# Patient Record
Sex: Male | Born: 1950 | Race: Black or African American | Hispanic: No | Marital: Single | Smoking: Never smoker
Health system: Southern US, Community
[De-identification: ages and names within clinical notes are randomized; demographics above are authoritative.]

## PROBLEM LIST (undated history)

## (undated) DIAGNOSIS — J45909 Unspecified asthma, uncomplicated: Secondary | ICD-10-CM

## (undated) DIAGNOSIS — I10 Essential (primary) hypertension: Secondary | ICD-10-CM

---

## 2008-07-25 ENCOUNTER — Ambulatory Visit: Payer: Self-pay | Admitting: Diagnostic Radiology

## 2008-07-25 ENCOUNTER — Emergency Department (HOSPITAL_BASED_OUTPATIENT_CLINIC_OR_DEPARTMENT_OTHER): Admission: EM | Admit: 2008-07-25 | Discharge: 2008-07-25 | Payer: Self-pay | Admitting: Emergency Medicine

## 2010-08-21 LAB — BASIC METABOLIC PANEL
BUN: 18 mg/dL (ref 6–23)
CO2: 28 mEq/L (ref 19–32)
Chloride: 105 mEq/L (ref 96–112)
Creatinine, Ser: 1.2 mg/dL (ref 0.4–1.5)
GFR calc Af Amer: >60 mL/min (ref >60)
Potassium: 3.5 mEq/L (ref 3.5–5.1)

## 2010-08-21 LAB — POCT I-STAT 3, VENOUS BLOOD GAS (G3P V)
Bicarbonate: 30.3 mEq/L — ABNORMAL HIGH (ref 20.0–24.0)
O2 Saturation: 67 %
TCO2: 32 mmol/L (ref 0–100)
pCO2, Ven: 52.7 mmHg — ABNORMAL HIGH (ref 45.0–50.0)
pH, Ven: 7.365 — ABNORMAL HIGH (ref 7.250–7.300)

## 2014-04-16 ENCOUNTER — Encounter (HOSPITAL_BASED_OUTPATIENT_CLINIC_OR_DEPARTMENT_OTHER): Payer: Self-pay | Admitting: Emergency Medicine

## 2014-04-16 ENCOUNTER — Emergency Department (HOSPITAL_BASED_OUTPATIENT_CLINIC_OR_DEPARTMENT_OTHER): Payer: 59

## 2014-04-16 DIAGNOSIS — S3992XA Unspecified injury of lower back, initial encounter: Secondary | ICD-10-CM | POA: Insufficient documentation

## 2014-04-16 DIAGNOSIS — Y998 Other external cause status: Secondary | ICD-10-CM | POA: Insufficient documentation

## 2014-04-16 DIAGNOSIS — I10 Essential (primary) hypertension: Secondary | ICD-10-CM | POA: Insufficient documentation

## 2014-04-16 DIAGNOSIS — Y9389 Activity, other specified: Secondary | ICD-10-CM | POA: Diagnosis not present

## 2014-04-16 DIAGNOSIS — J45909 Unspecified asthma, uncomplicated: Secondary | ICD-10-CM | POA: Diagnosis not present

## 2014-04-16 DIAGNOSIS — Y92410 Unspecified street and highway as the place of occurrence of the external cause: Secondary | ICD-10-CM | POA: Diagnosis not present

## 2014-04-16 DIAGNOSIS — Z79899 Other long term (current) drug therapy: Secondary | ICD-10-CM | POA: Diagnosis not present
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
## 2014-04-17 ENCOUNTER — Encounter (HOSPITAL_BASED_OUTPATIENT_CLINIC_OR_DEPARTMENT_OTHER): Payer: Self-pay | Admitting: Emergency Medicine

## 2014-04-17 ENCOUNTER — Emergency Department (HOSPITAL_BASED_OUTPATIENT_CLINIC_OR_DEPARTMENT_OTHER)
Admission: EM | Admit: 2014-04-17 | Discharge: 2014-04-17 | Disposition: A | Discharge status: Home / Self Care | Payer: 59 | Attending: Emergency Medicine | Admitting: Emergency Medicine

## 2014-04-17 DIAGNOSIS — M62830 Muscle spasm of back: Secondary | ICD-10-CM

## 2014-04-17 DIAGNOSIS — R52 Pain, unspecified: Secondary | ICD-10-CM

## 2014-04-17 DIAGNOSIS — S3992XA Unspecified injury of lower back, initial encounter: Secondary | ICD-10-CM | POA: Diagnosis not present

## 2014-04-17 HISTORY — DX: Unspecified asthma, uncomplicated: J45.909

## 2014-04-17 HISTORY — DX: Essential (primary) hypertension: I10

## 2014-04-17 MED ORDER — METHOCARBAMOL 500 MG PO TABS: 500.0000 mg | ORAL_TABLET | Freq: Two times a day (BID) | ORAL | Status: DC

## 2014-04-17 MED ORDER — NAPROXEN 375 MG PO TABS: 375.0000 mg | ORAL_TABLET | Freq: Two times a day (BID) | ORAL | Status: DC

## 2014-04-17 MED ORDER — NAPROXEN 250 MG PO TABS
500.0000 mg | ORAL_TABLET | Freq: Once | ORAL | Status: AC
  Administered 2014-04-17: 500 mg via ORAL
  Filled 2014-04-17: qty 2

## 2014-04-17 MED ORDER — METHOCARBAMOL 500 MG PO TABS
1000.0000 mg | ORAL_TABLET | Freq: Once | ORAL | Status: AC
  Administered 2014-04-17: 1000 mg via ORAL
  Filled 2014-04-17: qty 2
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
## 2014-06-13 ENCOUNTER — Emergency Department (HOSPITAL_BASED_OUTPATIENT_CLINIC_OR_DEPARTMENT_OTHER)
Admission: EM | Admit: 2014-06-13 | Discharge: 2014-06-13 | Disposition: A | Discharge status: Home / Self Care | Payer: Self-pay | Attending: Emergency Medicine | Admitting: Emergency Medicine

## 2014-06-13 ENCOUNTER — Encounter (HOSPITAL_BASED_OUTPATIENT_CLINIC_OR_DEPARTMENT_OTHER): Payer: Self-pay | Admitting: Emergency Medicine

## 2014-06-13 ENCOUNTER — Emergency Department (HOSPITAL_BASED_OUTPATIENT_CLINIC_OR_DEPARTMENT_OTHER): Payer: Self-pay

## 2014-06-13 DIAGNOSIS — Z760 Encounter for issue of repeat prescription: Secondary | ICD-10-CM | POA: Insufficient documentation

## 2014-06-13 DIAGNOSIS — Y998 Other external cause status: Secondary | ICD-10-CM | POA: Insufficient documentation

## 2014-06-13 DIAGNOSIS — S86912A Strain of unspecified muscle(s) and tendon(s) at lower leg level, left leg, initial encounter: Secondary | ICD-10-CM

## 2014-06-13 DIAGNOSIS — I10 Essential (primary) hypertension: Secondary | ICD-10-CM | POA: Insufficient documentation

## 2014-06-13 DIAGNOSIS — Y9289 Other specified places as the place of occurrence of the external cause: Secondary | ICD-10-CM | POA: Insufficient documentation

## 2014-06-13 DIAGNOSIS — S86812A Strain of other muscle(s) and tendon(s) at lower leg level, left leg, initial encounter: Secondary | ICD-10-CM | POA: Insufficient documentation

## 2014-06-13 DIAGNOSIS — J45909 Unspecified asthma, uncomplicated: Secondary | ICD-10-CM | POA: Insufficient documentation

## 2014-06-13 DIAGNOSIS — M25512 Pain in left shoulder: Secondary | ICD-10-CM | POA: Insufficient documentation

## 2014-06-13 DIAGNOSIS — R52 Pain, unspecified: Secondary | ICD-10-CM

## 2014-06-13 DIAGNOSIS — W000XXA Fall on same level due to ice and snow, initial encounter: Secondary | ICD-10-CM | POA: Insufficient documentation

## 2014-06-13 DIAGNOSIS — Z79899 Other long term (current) drug therapy: Secondary | ICD-10-CM | POA: Insufficient documentation

## 2014-06-13 DIAGNOSIS — Y9389 Activity, other specified: Secondary | ICD-10-CM | POA: Insufficient documentation

## 2014-06-13 MED ORDER — LISINOPRIL-HYDROCHLOROTHIAZIDE 10-12.5 MG PO TABS: 1.0000 | ORAL_TABLET | Freq: Every day | ORAL | Status: AC

## 2014-06-13 NOTE — Discharge Instructions (Signed)
Take Tylenol as directed for pain. Call Dr.Hudnall to schedule an office visit if you continue to have significant pain in your left knee or left shoulder within the next week.
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
## 2017-01-16 ENCOUNTER — Emergency Department (HOSPITAL_BASED_OUTPATIENT_CLINIC_OR_DEPARTMENT_OTHER): Payer: Medicare Other

## 2017-01-16 ENCOUNTER — Emergency Department (HOSPITAL_BASED_OUTPATIENT_CLINIC_OR_DEPARTMENT_OTHER)
Admission: EM | Admit: 2017-01-16 | Discharge: 2017-01-16 | Disposition: A | Discharge status: Home / Self Care | Payer: Medicare Other | Attending: Emergency Medicine | Admitting: Emergency Medicine

## 2017-01-16 ENCOUNTER — Encounter (HOSPITAL_BASED_OUTPATIENT_CLINIC_OR_DEPARTMENT_OTHER): Payer: Self-pay

## 2017-01-16 DIAGNOSIS — I10 Essential (primary) hypertension: Secondary | ICD-10-CM | POA: Insufficient documentation

## 2017-01-16 DIAGNOSIS — Y939 Activity, unspecified: Secondary | ICD-10-CM | POA: Insufficient documentation

## 2017-01-16 DIAGNOSIS — M7041 Prepatellar bursitis, right knee: Secondary | ICD-10-CM | POA: Insufficient documentation

## 2017-01-16 DIAGNOSIS — J45909 Unspecified asthma, uncomplicated: Secondary | ICD-10-CM | POA: Insufficient documentation

## 2017-01-16 DIAGNOSIS — M25561 Pain in right knee: Secondary | ICD-10-CM | POA: Diagnosis present

## 2017-01-16 DIAGNOSIS — Z79899 Other long term (current) drug therapy: Secondary | ICD-10-CM | POA: Insufficient documentation

## 2017-01-16 DIAGNOSIS — M7051 Other bursitis of knee, right knee: Secondary | ICD-10-CM

## 2017-01-16 MED ORDER — SULFAMETHOXAZOLE-TRIMETHOPRIM 800-160 MG PO TABS
1.0000 | ORAL_TABLET | Freq: Once | ORAL | Status: AC
  Administered 2017-01-16: 1 via ORAL
  Filled 2017-01-16: qty 1

## 2017-01-16 MED ORDER — NAPROXEN 375 MG PO TABS: 375.0000 mg | ORAL_TABLET | Freq: Two times a day (BID) | ORAL | 0 refills | Status: DC

## 2017-01-16 MED ORDER — SULFAMETHOXAZOLE-TRIMETHOPRIM 800-160 MG PO TABS: 1.0000 | ORAL_TABLET | Freq: Two times a day (BID) | ORAL | 0 refills | Status: AC

## 2017-01-16 MED ORDER — TRAMADOL HCL 50 MG PO TABS: 100.0000 mg | ORAL_TABLET | Freq: Four times a day (QID) | ORAL | 0 refills | Status: DC | PRN

## 2017-01-16 MED ORDER — NAPROXEN 250 MG PO TABS
500.0000 mg | ORAL_TABLET | Freq: Once | ORAL | Status: AC
  Administered 2017-01-16: 500 mg via ORAL
  Filled 2017-01-16: qty 2

## 2017-01-16 NOTE — ED Notes (Signed)
Ace wrap applied to right knee

## 2017-01-16 NOTE — ED Notes (Addendum)
Pt stated that he was kneeling while he was installing a water heater.  Pt stated that he did not feel the pain until midnight and noted that his right knee is red and swollen.  He took Tylenol 326 mg 2 tabs last time  was at 4 pm with minimal relief.  Patient took 8 tabs total within 24 hours.

## 2017-01-16 NOTE — ED Provider Notes (Signed)
[facility]   CSN: [id number] Arrival date & time: 01/16/17  1841     History   Chief Complaint Chief Complaint  Patient presents with  . Knee Pain    HPI Colin Lara is a 66 y.o. male.  HPI Patient reports that he was kneeling on a concrete surface yesterday to replace a water heater. He reports yesterday evening the knee started to get slightly sore. This morning he reports that he got more swelling and tenderness of the knee. He reports it is slightly red. He reports he can bend and straighten it but it is uncomfortable. No fevers, no chills, no general malaise. Patient reports he has something similar to this a couple of years ago on the opposite knee. He reports that he believes it was diagnosed as a bursitis. He reports that he took antibiotics at that time and had gotten a steroid injection. He reports it got better over time. He reports at the time he got evaluated though it was much more painful and swollen than this is today. Past Medical History:  Diagnosis Date  . Asthma   . Hypertension     There are no active problems to display for this patient.   History reviewed. No pertinent surgical history.     Home Medications    Prior to Admission medications   Medication Sig Start Date End Date Taking? Authorizing Provider  albuterol (PROVENTIL) (2.5 MG/3ML) 0.083% nebulizer solution Take 2.5 mg by nebulization every 6 (six) hours as needed for wheezing or shortness of breath.    [provider]  albuterol (PROVENTIL) (5 MG/ML) 0.5% nebulizer solution Take 2.5 mg by nebulization every 6 (six) hours as needed for wheezing or shortness of breath.    [provider]  lisinopril-hydrochlorothiazide (PRINZIDE) 10-12.5 MG per tablet Take 1 tablet by mouth daily. 06/13/14   Doug Sou, MD  lisinopril-hydrochlorothiazide (PRINZIDE,ZESTORETIC) 10-12.5 MG per tablet Take 1 tablet by mouth daily.    [provider]    naproxen (NAPROSYN) 375 MG tablet Take 1 tablet (375 mg total) by mouth 2 (two) times daily. 01/16/17   Arby Barrette, MD  sulfamethoxazole-trimethoprim (BACTRIM DS,SEPTRA DS) 800-160 MG tablet Take 1 tablet by mouth 2 (two) times daily. 01/16/17 01/23/17  Arby Barrette, MD  traMADol (ULTRAM) 50 MG tablet Take 2 tablets (100 mg total) by mouth every 6 (six) hours as needed. 1-2 tablets every 6 hours as needed for pain 01/16/17   Arby Barrette, MD    Family History No family history on file.  Social History Social History  Substance Use Topics  . Smoking status: Never Smoker  . Smokeless tobacco: Not on file  . Alcohol use Yes     Comment: twice a year     Allergies   Patient has no known allergies.   Review of Systems Review of Systems 10 Systems reviewed and are negative for acute change except as noted in the HPI.   Physical Exam Updated Vital Signs BP (!) 161/98 (BP Location: Right Arm)   Pulse 85   Temp 98.3 F (36.8 C) (Axillary)   Resp 16   SpO2 99%   Physical Exam  Constitutional: He is oriented to person, place, and time. He appears well-developed and well-nourished. No distress.  HENT:  Head: Normocephalic and atraumatic.  Eyes: EOM are normal.  Pulmonary/Chest: Effort normal.  Musculoskeletal:  Patient has moderate swelling of the prepatellar area on the right knee. Mild to moderate tenderness to movement of  the patella. Mild diffuse erythema and warmth. Patient does not have severe pain with range of motion of the joint. No edema of the lower leg or calf tenderness.  Neurological: He is alert and oriented to person, place, and time. No cranial nerve deficit. He exhibits normal muscle tone. Coordination normal.  Skin: Skin is warm and dry.  Psychiatric: He has a normal mood and affect.         ED Treatments / Results  Labs (all labs ordered are listed, but only abnormal results are displayed) Labs Reviewed - No data to display  EKG  EKG  Interpretation None       Radiology Dg Knee Complete 4 Views Right  Result Date: 01/16/2017 CLINICAL DATA:  Medial right knee pain EXAM: RIGHT KNEE - COMPLETE 4+ VIEW COMPARISON:  None. FINDINGS: Swelling anterior to the knee. No acute fracture or malalignment. No large joint effusion. Joint spaces are maintained IMPRESSION: Prepatellar soft tissue swelling.  No acute osseous abnormality Electronically Signed   By: Jasmine PangKim  Fujinaga M.D.   On: 01/16/2017 19:12    Procedures Procedures (including critical care time)  Medications Ordered in ED Medications  sulfamethoxazole-trimethoprim (BACTRIM DS,SEPTRA DS) 800-160 MG per tablet 1 tablet (1 tablet Oral Given 01/16/17 2149)  naproxen (NAPROSYN) tablet 500 mg (500 mg Oral Given 01/16/17 2149)     Initial Impression / Assessment and Plan / ED Course  I have reviewed the triage vital signs and the nursing notes.  Pertinent labs & imaging results that were available during my care of the patient were reviewed by me and considered in my medical decision making (see chart for details).     Final Clinical Impressions(s) / ED Diagnoses   Final diagnoses:  Patellar bursitis of right knee  Patient has prepatellar bursitis after kneeling on concrete yesterday. At this time, patient does have some erythema and warmth. Somewhat difficult to discern if this is inflammatory reaction or early cellulitis. Patient does not have pain within the joint to suggest septic joint. No palpable fluctuance although there is moderate swelling is visible in images. At this time, will empirically treat with Bactrim. Patient will take naproxen for pain control. Compression and elevation and icing reviewed. Patient will follow-up with orthopedics.  New Prescriptions New Prescriptions   NAPROXEN (NAPROSYN) 375 MG TABLET    Take 1 tablet (375 mg total) by mouth 2 (two) times daily.   SULFAMETHOXAZOLE-TRIMETHOPRIM (BACTRIM DS,SEPTRA DS) 800-160 MG TABLET    Take 1 tablet by  mouth 2 (two) times daily.   TRAMADOL (ULTRAM) 50 MG TABLET    Take 2 tablets (100 mg total) by mouth every 6 (six) hours as needed. 1-2 tablets every 6 hours as needed for pain     Arby BarrettePfeiffer, Aitana Burry, MD 01/16/17 2158

## 2017-01-16 NOTE — ED Triage Notes (Signed)
Pt c/o R knee pain that started last PM. Denies injury.

## 2018-01-18 ENCOUNTER — Other Ambulatory Visit: Payer: Self-pay

## 2018-01-18 ENCOUNTER — Emergency Department (HOSPITAL_BASED_OUTPATIENT_CLINIC_OR_DEPARTMENT_OTHER): Payer: Medicare Other

## 2018-01-18 ENCOUNTER — Encounter (HOSPITAL_BASED_OUTPATIENT_CLINIC_OR_DEPARTMENT_OTHER): Payer: Self-pay | Admitting: Emergency Medicine

## 2018-01-18 ENCOUNTER — Inpatient Hospital Stay (HOSPITAL_BASED_OUTPATIENT_CLINIC_OR_DEPARTMENT_OTHER)
Admission: EM | Admit: 2018-01-18 | Discharge: 2018-01-21 | DRG: 337 | Disposition: A | Discharge status: Home / Self Care | Payer: Medicare Other | Source: Other Acute Inpatient Hospital | Attending: Surgery | Admitting: Surgery

## 2018-01-18 DIAGNOSIS — Z888 Allergy status to other drugs, medicaments and biological substances status: Secondary | ICD-10-CM

## 2018-01-18 DIAGNOSIS — K5651 Intestinal adhesions [bands], with partial obstruction: Secondary | ICD-10-CM | POA: Diagnosis not present

## 2018-01-18 DIAGNOSIS — Z7951 Long term (current) use of inhaled steroids: Secondary | ICD-10-CM

## 2018-01-18 DIAGNOSIS — Z880 Allergy status to penicillin: Secondary | ICD-10-CM | POA: Diagnosis not present

## 2018-01-18 DIAGNOSIS — J45909 Unspecified asthma, uncomplicated: Secondary | ICD-10-CM | POA: Diagnosis not present

## 2018-01-18 DIAGNOSIS — Z91048 Other nonmedicinal substance allergy status: Secondary | ICD-10-CM | POA: Diagnosis not present

## 2018-01-18 DIAGNOSIS — I10 Essential (primary) hypertension: Secondary | ICD-10-CM | POA: Diagnosis not present

## 2018-01-18 DIAGNOSIS — Z79899 Other long term (current) drug therapy: Secondary | ICD-10-CM

## 2018-01-18 DIAGNOSIS — E7800 Pure hypercholesterolemia, unspecified: Secondary | ICD-10-CM | POA: Diagnosis not present

## 2018-01-18 DIAGNOSIS — K56609 Unspecified intestinal obstruction, unspecified as to partial versus complete obstruction: Secondary | ICD-10-CM | POA: Diagnosis not present

## 2018-01-18 DIAGNOSIS — Z0189 Encounter for other specified special examinations: Secondary | ICD-10-CM

## 2018-01-18 DIAGNOSIS — K439 Ventral hernia without obstruction or gangrene: Secondary | ICD-10-CM | POA: Diagnosis not present

## 2018-01-18 DIAGNOSIS — R52 Pain, unspecified: Secondary | ICD-10-CM

## 2018-01-18 LAB — CBC
HCT: 42 % (ref 39.0–52.0)
HEMATOCRIT: 42.8 % (ref 39.0–52.0)
HEMOGLOBIN: 14.5 g/dL (ref 13.0–17.0)
Hemoglobin: 13.8 g/dL (ref 13.0–17.0)
MCH: 29.4 pg (ref 26.0–34.0)
MCH: 30 pg (ref 26.0–34.0)
MCHC: 32.9 g/dL (ref 30.0–36.0)
MCHC: 33.9 g/dL (ref 30.0–36.0)
MCV: 89.4 fL (ref 78.0–100.0)
MCV: 88.6 fL (ref 78.0–100.0)
PLATELETS: 359 10*3/uL (ref 150–400)
Platelets: 331 10*3/uL (ref 150–400)
RBC: 4.7 MIL/uL (ref 4.22–5.81)
RBC: 4.83 MIL/uL (ref 4.22–5.81)
RDW: 13.9 % (ref 11.5–15.5)
RDW: 13.7 % (ref 11.5–15.5)
WBC: 11.7 10*3/uL — ABNORMAL HIGH (ref 4.0–10.5)
WBC: 10.6 10*3/uL — ABNORMAL HIGH (ref 4.0–10.5)

## 2018-01-18 LAB — COMPREHENSIVE METABOLIC PANEL
ALK PHOS: 69 U/L (ref 38–126)
ALT: 19 U/L (ref 0–44)
AST: 26 U/L (ref 15–41)
Albumin: 4.2 g/dL (ref 3.5–5.0)
Anion gap: 6 (ref 5–15)
BUN: 14 mg/dL (ref 8–23)
CALCIUM: 9.1 mg/dL (ref 8.9–10.3)
CHLORIDE: 102 mmol/L (ref 98–111)
CO2: 31 mmol/L (ref 22–32)
CREATININE: 0.96 mg/dL (ref 0.61–1.24)
GFR calc Af Amer: >60 mL/min (ref >60)
Glucose, Bld: 135 mg/dL — ABNORMAL HIGH (ref 70–99)
Potassium: 4.2 mmol/L (ref 3.5–5.1)
Sodium: 139 mmol/L (ref 135–145)
Total Bilirubin: 0.8 mg/dL (ref 0.3–1.2)
Total Protein: 7.8 g/dL (ref 6.5–8.1)

## 2018-01-18 LAB — URINALYSIS, MICROSCOPIC (REFLEX)

## 2018-01-18 LAB — URINALYSIS, ROUTINE W REFLEX MICROSCOPIC
BILIRUBIN URINE: NEGATIVE
Glucose, UA: NEGATIVE mg/dL
HGB URINE DIPSTICK: NEGATIVE
Ketones, ur: NEGATIVE mg/dL
Leukocytes, UA: NEGATIVE
Nitrite: NEGATIVE
PH: 7.5 (ref 5.0–8.0)
Protein, ur: 100 mg/dL — AB
SPECIFIC GRAVITY, URINE: 1.025 (ref 1.005–1.030)

## 2018-01-18 LAB — CREATININE, SERUM: Creatinine, Ser: 1 mg/dL (ref 0.61–1.24)

## 2018-01-18 LAB — LIPASE, BLOOD: LIPASE: 73 U/L — AB (ref 11–51)

## 2018-01-18 MED ORDER — MORPHINE SULFATE (PF) 2 MG/ML IV SOLN
1.0000 mg | INTRAVENOUS | Status: DC | PRN
  Administered 2018-01-18 – 2018-01-21 (×7): 2 mg via INTRAVENOUS
  Filled 2018-01-18 (×8): qty 1

## 2018-01-18 MED ORDER — HEPARIN SODIUM (PORCINE) 5000 UNIT/ML IJ SOLN
5000.0000 [IU] | Freq: Three times a day (TID) | INTRAMUSCULAR | Status: DC
  Administered 2018-01-18 – 2018-01-21 (×8): 5000 [IU] via SUBCUTANEOUS
  Filled 2018-01-18 (×8): qty 1

## 2018-01-18 MED ORDER — ONDANSETRON HCL 4 MG/2ML IJ SOLN
4.0000 mg | Freq: Four times a day (QID) | INTRAMUSCULAR | Status: DC | PRN
  Administered 2018-01-19: 4 mg via INTRAVENOUS
  Filled 2018-01-18: qty 2

## 2018-01-18 MED ORDER — ALBUTEROL SULFATE (2.5 MG/3ML) 0.083% IN NEBU: 2.5000 mg | INHALATION_SOLUTION | Freq: Four times a day (QID) | RESPIRATORY_TRACT | Status: DC | PRN

## 2018-01-18 MED ORDER — ONDANSETRON 4 MG PO TBDP: 4.0000 mg | ORAL_TABLET | Freq: Four times a day (QID) | ORAL | Status: DC | PRN

## 2018-01-18 MED ORDER — SODIUM CHLORIDE 0.9 % IV BOLUS
1000.0000 mL | Freq: Once | INTRAVENOUS | Status: AC
  Administered 2018-01-18: 1000 mL via INTRAVENOUS

## 2018-01-18 MED ORDER — IOPAMIDOL (ISOVUE-300) INJECTION 61%
100.0000 mL | Freq: Once | INTRAVENOUS | Status: AC | PRN
  Administered 2018-01-18: 100 mL via INTRAVENOUS

## 2018-01-18 MED ORDER — HYDRALAZINE HCL 20 MG/ML IJ SOLN: 10.0000 mg | INTRAMUSCULAR | Status: DC | PRN

## 2018-01-18 MED ORDER — KCL IN DEXTROSE-NACL 20-5-0.45 MEQ/L-%-% IV SOLN
INTRAVENOUS | Status: DC
  Administered 2018-01-18 – 2018-01-20 (×5): via INTRAVENOUS
  Filled 2018-01-18 (×4): qty 1000

## 2018-01-18 MED ORDER — ONDANSETRON HCL 4 MG/2ML IJ SOLN
4.0000 mg | Freq: Once | INTRAMUSCULAR | Status: AC
  Administered 2018-01-18: 4 mg via INTRAVENOUS
  Filled 2018-01-18: qty 2

## 2018-01-18 MED ORDER — MORPHINE SULFATE (PF) 4 MG/ML IV SOLN
4.0000 mg | Freq: Once | INTRAVENOUS | Status: AC
  Administered 2018-01-18: 4 mg via INTRAVENOUS
  Filled 2018-01-18: qty 1

## 2018-01-18 MED ORDER — FLUTICASONE FUROATE-VILANTEROL 200-25 MCG/INH IN AEPB
1.0000 | INHALATION_SPRAY | Freq: Every day | RESPIRATORY_TRACT | Status: DC
  Administered 2018-01-19 – 2018-01-21 (×3): 1 via RESPIRATORY_TRACT
  Filled 2018-01-18: qty 28

## 2018-01-18 MED ORDER — FAMOTIDINE IN NACL 20-0.9 MG/50ML-% IV SOLN
20.0000 mg | Freq: Two times a day (BID) | INTRAVENOUS | Status: DC
  Administered 2018-01-18 – 2018-01-21 (×6): 20 mg via INTRAVENOUS
  Filled 2018-01-18 (×6): qty 50

## 2018-01-18 MED ORDER — IOPAMIDOL (ISOVUE-300) INJECTION 61%
30.0000 mL | Freq: Once | INTRAVENOUS | Status: AC | PRN
  Administered 2018-01-18: 15 mL via ORAL

## 2018-01-18 NOTE — ED Notes (Signed)
Portable KUB done , Plunkett verified placement

## 2018-01-18 NOTE — H&P (Signed)
Chief Complaint: Abdominal pain nausea and vomiting  History of Present Illness:  Colin Lara is an 67 y.o. male presented to [facility] after drinking orange juice this morning and having acute onset of abdominal pain nausea and vomiting.  He had no prodrome or problems during the night.  He had a fairly bland dinner.  He is never had any prior surgery.  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  When he was seen in the [facility] a CT scan was obtained which showed dilated loops of small bowel in the left upper quadrant.  He also had stool in the  right side.  An NG tube was placed at that facility and since placement he has had output and marked relief of his discomfort.  He appears stable at the present time.  Past Medical History:  Diagnosis Date  . Asthma   . Hypertension     History reviewed. No pertinent surgical history.  No current facility-administered medications for this encounter.    Current Outpatient Medications  Medication Sig Dispense Refill  . albuterol (PROVENTIL) (2.5 MG/3ML) 0.083% nebulizer solution Take 2.5 mg by nebulization every 6 (six) hours as needed for wheezing or shortness of breath.    . ezetimibe (ZETIA) 10 MG tablet Take 10 mg by mouth daily.     . fluticasone furoate-vilanterol (BREO ELLIPTA) 200-25 MCG/INH AEPB Inhale 1 puff into the lungs daily.    Marland Kitchen lisinopril-hydrochlorothiazide (PRINZIDE) 10-12.5 MG per tablet Take 1 tablet by mouth daily. 30 tablet 0  . naproxen (NAPROSYN) 375 MG tablet Take 1 tablet (375 mg total) by mouth 2 (two) times daily. (Patient not taking: Reported on 01/18/2018) 20 tablet 0  . traMADol (ULTRAM) 50 MG tablet Take 2 tablets (100 mg total) by mouth every 6 (six) hours as needed. 1-2 tablets every 6 hours as needed for pain (Patient not taking: Reported on 01/18/2018) 20 tablet 0   Penicillins; Pollen extract; and Statins No  family history on file. Social History:   reports that he has never smoked. He has never used smokeless tobacco. He reports that he drinks alcohol. He reports that he does not use drugs.   REVIEW OF SYSTEMS : Negative except for unremarkable  Physical Exam:   Blood pressure (!) 153/98, pulse 81, temperature 97.8 F (36.6 C), temperature source Oral, resp. rate 18, height 5' 10.5" (1.791 m), weight 78 kg, SpO2 96 %. Body mass index is 24.33 kg/m.  Gen:  WDWN African-American male NAD  Neurological: Alert and oriented to person, place, and time. Motor and sensory function is grossly intact  Head: Normocephalic and atraumatic.  Eyes: Conjunctivae are normal. Pupils are equal, round, and reactive to light. No scleral icterus.  Neck: Normal range of motion. Neck supple. No tracheal deviation or thyromegaly present.  Cardiovascular:  SR without murmurs or gallops.  No carotid bruits Breast: Not examined Respiratory: Effort normal.  No respiratory distress. No chest wall tenderness. Breath sounds normal.  No wheezes, rales or rhonchi.  Abdomen: Mildly distended, no rebound or guarding.  I can palpate the epigastric hernia in the midline but there is nothing incarcerated in it and it is a vague defect. GU: Unremarkable Musculoskeletal: Normal range of motion. Extremities are nontender. No cyanosis, edema or clubbing noted Lymphadenopathy: No cervical, preauricular, postauricular or axillary adenopathy is present Skin: Skin is warm and dry. No rash noted. No diaphoresis. No  erythema. No pallor. Pscyh: Normal mood and affect. Behavior is normal. Judgment and thought content normal.   LABORATORY RESULTS: Results for orders placed or performed during the hospital encounter of 01/18/18 (from the past 48 hour(s))  Urinalysis, Routine w reflex microscopic     Status: Abnormal   Collection Time: 01/18/18  2:11 PM  Result Value Ref Range   Color, Urine YELLOW YELLOW   APPearance CLEAR CLEAR    Specific Gravity, Urine 1.025 1.005 - 1.030   pH 7.5 5.0 - 8.0   Glucose, UA NEGATIVE NEGATIVE mg/dL   Hgb urine dipstick NEGATIVE NEGATIVE   Bilirubin Urine NEGATIVE NEGATIVE   Ketones, ur NEGATIVE NEGATIVE mg/dL   Protein, ur 100 (A) NEGATIVE mg/dL   Nitrite NEGATIVE NEGATIVE   Leukocytes, UA NEGATIVE NEGATIVE    Comment: Performed at [facility], [address] [place], [place] [address]  Urinalysis, Microscopic (reflex)     Status: Abnormal   Collection Time: 01/18/18  2:11 PM  Result Value Ref Range   RBC / HPF 0-5 0 - 5 RBC/hpf   WBC, UA 0-5 0 - 5 WBC/hpf   Bacteria, UA RARE (A) NONE SEEN   Squamous Epithelial / LPF 0-5 0 - 5   Mucus PRESENT    Hyaline Casts, UA PRESENT     Comment: Performed at [facility], [address] [place], [place] [address]  Lipase, blood     Status: Abnormal   Collection Time: 01/18/18  2:38 PM  Result Value Ref Range   Lipase 73 (H) 11 - 51 U/L    Comment: Performed at [facility], [address] [place], [place] [address]  Comprehensive metabolic panel     Status: Abnormal   Collection Time: 01/18/18  2:38 PM  Result Value Ref Range   Sodium 139 135 - 145 mmol/L   Potassium 4.2 3.5 - 5.1 mmol/L   Chloride 102 98 - 111 mmol/L   CO2 31 22 - 32 mmol/L   Glucose, Bld 135 (H) 70 - 99 mg/dL   BUN 14 8 - 23 mg/dL   Creatinine, Ser 0.96 0.61 - 1.24 mg/dL   Calcium 9.1 8.9 - 10.3 mg/dL   Total Protein 7.8 6.5 - 8.1 g/dL   Albumin 4.2 3.5 - 5.0 g/dL   AST 26 15 - 41 U/L   ALT 19 0 - 44 U/L   Alkaline Phosphatase 69 38 - 126 U/L   Total Bilirubin 0.8 0.3 - 1.2 mg/dL   GFR calc non Af Amer >60 >60 mL/min   GFR calc Af Amer >60 >60 mL/min    Comment: (NOTE) The eGFR has been calculated using the CKD EPI equation. This calculation has not been validated in all clinical situations. eGFR's persistently <60 mL/min signify possible Chronic Kidney Disease.    Anion gap 6 5 - 15    Comment: Performed at [facility], [address] [place], [place] [address]  CBC     Status: Abnormal   Collection Time: 01/18/18  2:38 PM  Result Value Ref Range   WBC 10.6 (H) 4.0 - 10.5 K/uL   RBC 4.83 4.22 - 5.81 MIL/uL   Hemoglobin 14.5 13.0 - 17.0 g/dL   HCT 42.8 39.0 - 52.0 %   MCV 88.6 78.0 - 100.0 fL   MCH 30.0 26.0 - 34.0 pg   MCHC 33.9 30.0 - 36.0 g/dL   RDW 13.7 11.5 - 15.5 %  Platelets 331 150 - 400 K/uL    Comment: Performed at [facility], [address] [place], [place] [address]     RADIOLOGY RESULTS: Ct Abdomen Pelvis W Contrast  Result Date: 01/18/2018 CLINICAL DATA:  Abdominal pain EXAM: CT ABDOMEN AND PELVIS WITH CONTRAST TECHNIQUE: Multidetector CT imaging of the abdomen and pelvis was performed using the standard protocol following bolus administration of intravenous contrast. CONTRAST:  100 mL Isovue-300 COMPARISON:  None. FINDINGS: Lower chest: No acute abnormality. Hepatobiliary: Fatty infiltration of the liver is noted. The gallbladder is within normal limits. Pancreas: Unremarkable. No pancreatic ductal dilatation or surrounding inflammatory changes. Spleen: Normal in size without focal abnormality. Adrenals/Urinary Tract: Adrenal glands are within normal limits. The kidneys demonstrate normal enhancement pattern. No obstructive changes are seen. Bladder is partially decompressed. Stomach/Bowel: The colon is within normal limits. The appendix is well visualized. There are multiple dilated loops of small bowel identified in the left mid abdomen which is felt to represent an internal hernia with closed loop small bowel obstruction. The more distal small bowel is within normal limits. Transition zones are best visualized on image number 46 of series 2 of the axial plane and image 35 of series 5 in the coronal plane. Vascular/Lymphatic: Aortic atherosclerosis. No enlarged abdominal or pelvic lymph nodes. Reproductive: Prostate is unremarkable. Other: Mild amount of free  fluid is noted within the pelvis. No evidence of free air is seen. Musculoskeletal: Degenerative changes of the lumbar spine are noted. IMPRESSION: Changes consistent with localized small-bowel obstruction in the left mid abdomen likely related to an internal hernia. Surgical consultation is recommended. Critical Value/emergent results were called by telephone at the time of interpretation on 01/18/2018 at 4:28 pm to [facility] PA, who verbally acknowledged these results. Electronically Signed   By: Inez Catalina M.D.   On: 01/18/2018 16:32   Dg Abd Portable 1v  Result Date: 01/18/2018 CLINICAL DATA:  NG tube placement prior to transfer. EXAM: PORTABLE ABDOMEN - 1 VIEW COMPARISON:  CT scan 01/18/2018 FINDINGS: The NG tube is in the fundal region of the stomach. Contrast noted in the kidneys, ureters and bladder from the recent CT scan. Persistent mild small bowel dilatation in the left abdomen. No obvious free air. IMPRESSION: The NG tube is in the fundal region of the stomach. Electronically Signed   By: Marijo Sanes M.D.   On: 01/18/2018 18:03    Problem List: Patient Active Problem List   Diagnosis Date Noted  . SBO (small bowel obstruction) ([facility]) 01/18/2018    Assessment & Plan: Slightly elevated amylase.  X-rays suggestive of partial small bowel obstruction possibly closed loop.  Clinically he is much better with NG suction.  Plan admission with NG suction and reassess in the morning.  If passing flatus and clinically relieved we could possibly avoid the small bowel protocol.  I explained the plan to the patient and his wife.    Matt B. Hassell Done, MD, [facility] [phone] beeper [phone]  01/18/2018 7:03 PM

## 2018-01-18 NOTE — ED Provider Notes (Signed)
[facility] EMERGENCY DEPARTMENT Provider Note   CSN: [id number] Arrival date & time: 01/18/18  1411     History   Chief Complaint Chief Complaint  Patient presents with  . Abdominal Pain    HPI Colin Lara is a 67 y.o. male with a hx of asthma, hypercholesterolemia, and hypertension who presents to the ED with complaints of abdominal pain which started at 07:30 this morning. Patient states that yesterday was a fairly normal day for him, he woke up this AM and had a small portion of orange juice with subsequent onset of periumbilical abdominal pain. Pain has been constant since onset, but somewhat eased off, difficult for patient to qualify, currently a 5/10 in severity, no specific alleviating/aggravating factors. Reports associated nausea and vomiting- has had 6 episodes of non bloody emesis in total with last episode approximately 1 hour PTA. He has not been able to tolerate PO. Denies fever, chills, diarrhea, melena, hematochezia, dysuria, or testicular pain/swelling. Denies hx of similar pain. Denies recent foreign travel, abx, or hospitalizations.   HPI  Past Medical History:  Diagnosis Date  . Asthma   . Hypertension     There are no active problems to display for this patient.   History reviewed. No pertinent surgical history.      Home Medications    Prior to Admission medications   Medication Sig Start Date End Date Taking? Authorizing Provider  albuterol (PROVENTIL) (2.5 MG/3ML) 0.083% nebulizer solution Take 2.5 mg by nebulization every 6 (six) hours as needed for wheezing or shortness of breath.    [provider]  albuterol (PROVENTIL) (5 MG/ML) 0.5% nebulizer solution Take 2.5 mg by nebulization every 6 (six) hours as needed for wheezing or shortness of breath.    [provider]  lisinopril-hydrochlorothiazide (PRINZIDE) 10-12.5 MG per tablet Take 1 tablet by mouth daily. 06/13/14   Doug Sou, MD    lisinopril-hydrochlorothiazide (PRINZIDE,ZESTORETIC) 10-12.5 MG per tablet Take 1 tablet by mouth daily.    [provider]  naproxen (NAPROSYN) 375 MG tablet Take 1 tablet (375 mg total) by mouth 2 (two) times daily. 01/16/17   Arby Barrette, MD  traMADol (ULTRAM) 50 MG tablet Take 2 tablets (100 mg total) by mouth every 6 (six) hours as needed. 1-2 tablets every 6 hours as needed for pain 01/16/17   Arby Barrette, MD    Family History No family history on file.  Social History Social History   Tobacco Use  . Smoking status: Never Smoker  . Smokeless tobacco: Never Used  Substance Use Topics  . Alcohol use: Yes    Comment: twice a year  . Drug use: No     Allergies   Penicillins   Review of Systems Review of Systems  Constitutional: Negative for chills and fever.  Respiratory: Negative for shortness of breath.   Cardiovascular: Negative for chest pain.  Gastrointestinal: Positive for abdominal pain, nausea and vomiting. Negative for blood in stool, constipation and diarrhea.  Genitourinary: Negative for dysuria, hematuria, scrotal swelling and testicular pain.  All other systems reviewed and are negative.    Physical Exam Updated Vital Signs BP (!) 163/92 (BP Location: Left Arm)   Pulse 72   Temp 97.9 F (36.6 C) (Oral)   Resp 20   Ht 5' 10.5" (1.791 m)   Wt 78 kg   SpO2 100%   BMI 24.33 kg/m   Physical Exam  Constitutional: He appears well-developed and well-nourished.  Non-toxic appearance. No distress.  HENT:  Head: Normocephalic and atraumatic.  Eyes: Conjunctivae are normal. Right eye exhibits no discharge. Left eye exhibits no discharge.  Neck: Neck supple.  Cardiovascular: Normal rate and regular rhythm.  Pulmonary/Chest: Effort normal and breath sounds normal. No respiratory distress. He has no wheezes. He has no rhonchi. He has no rales.  Respiration even and unlabored  Abdominal: Soft. He exhibits distension (mild). There is tenderness  in the epigastric area, periumbilical area, left upper quadrant and left lower quadrant. There is no rebound, no guarding and no tenderness at McBurney's point. Rigidity: mild. A hernia (small palpable ventral defect above level of umbilicus, reducible without overlying skin changes) is present.  Neurological: He is alert.  Clear speech.   Skin: Skin is warm and dry. No rash noted.  Psychiatric: He has a normal mood and affect. His behavior is normal.  Nursing note and vitals reviewed.  ED Treatments / Results  Labs (all labs ordered are listed, but only abnormal results are displayed) Labs Reviewed  LIPASE, BLOOD - Abnormal; Notable for the following components:      Result Value   Lipase 73 (*)    All other components within normal limits  COMPREHENSIVE METABOLIC PANEL - Abnormal; Notable for the following components:   Glucose, Bld 135 (*)    All other components within normal limits  URINALYSIS, ROUTINE W REFLEX MICROSCOPIC - Abnormal; Notable for the following components:   Protein, ur 100 (*)    All other components within normal limits  CBC - Abnormal; Notable for the following components:   WBC 10.6 (*)    All other components within normal limits  URINALYSIS, MICROSCOPIC (REFLEX) - Abnormal; Notable for the following components:   Bacteria, UA RARE (*)    All other components within normal limits    EKG None  Radiology Ct Abdomen Pelvis W Contrast  Result Date: 01/18/2018 CLINICAL DATA:  Abdominal pain EXAM: CT ABDOMEN AND PELVIS WITH CONTRAST TECHNIQUE: Multidetector CT imaging of the abdomen and pelvis was performed using the standard protocol following bolus administration of intravenous contrast. CONTRAST:  100 mL Isovue-300 COMPARISON:  None. FINDINGS: Lower chest: No acute abnormality. Hepatobiliary: Fatty infiltration of the liver is noted. The gallbladder is within normal limits. Pancreas: Unremarkable. No pancreatic ductal dilatation or surrounding inflammatory  changes. Spleen: Normal in size without focal abnormality. Adrenals/Urinary Tract: Adrenal glands are within normal limits. The kidneys demonstrate normal enhancement pattern. No obstructive changes are seen. Bladder is partially decompressed. Stomach/Bowel: The colon is within normal limits. The appendix is well visualized. There are multiple dilated loops of small bowel identified in the left mid abdomen which is felt to represent an internal hernia with closed loop small bowel obstruction. The more distal small bowel is within normal limits. Transition zones are best visualized on image number 46 of series 2 of the axial plane and image 35 of series 5 in the coronal plane. Vascular/Lymphatic: Aortic atherosclerosis. No enlarged abdominal or pelvic lymph nodes. Reproductive: Prostate is unremarkable. Other: Mild amount of free fluid is noted within the pelvis. No evidence of free air is seen. Musculoskeletal: Degenerative changes of the lumbar spine are noted. IMPRESSION: Changes consistent with localized small-bowel obstruction in the left mid abdomen likely related to an internal hernia. Surgical consultation is recommended. Critical Value/emergent results were called by telephone at the time of interpretation on 01/18/2018 at 4:28 pm to [facility] PA, who verbally acknowledged these results. Electronically Signed   By: Alcide Clever M.D.   On: 01/18/2018  16:32    Procedures Procedures (including critical care time)  Medications Ordered in ED Medications  sodium chloride 0.9 % bolus 1,000 mL (0 mLs Intravenous Stopped 01/18/18 1612)  morphine 4 MG/ML injection 4 mg (4 mg Intravenous Given 01/18/18 1456)  ondansetron (ZOFRAN) injection 4 mg (4 mg Intravenous Given 01/18/18 1456)  iopamidol (ISOVUE-300) 61 % injection 100 mL (100 mLs Intravenous Contrast Given 01/18/18 1557)  iopamidol (ISOVUE-300) 61 % injection 30 mL (15 mLs Oral Contrast Given 01/18/18 1500)     Initial Impression / Assessment  and Plan / ED Course  I have reviewed the triage vital signs and the nursing notes.  Pertinent labs & imaging results that were available during my care of the patient were reviewed by me and considered in my medical decision making (see chart for details).  Patient presents to the emergency department with complaints of abdominal pain with associated nausea and vomiting. Nontoxic appearing, vitals with elevated BP, otherwise WNL. On exam mid to L sided abdominal tenderness, mild distension, possible hernia like defect in epigastric area, reducible. Will evaluate further with labs and CT abdomen/pelvis with contrast. Morphine, zofran, and fluids ordered.   Patient's labs have been reviewed: Nonspecific leukocytosis at 10.6.  No anemia.  Lipase is mildly elevated at 74, no prior on record for comparison.  No significant electrolyte disturbance.  Renal function and LFTs are WNL.  Urinalysis without obvious infection, there is proteinuria with rare bacteria.   16:28: CONSULT: Discussed with radiologist Dr. Karle Starch- CT scan with changes consistent with localized small-bowel obstruction in the left mid abdomen likely related to an internal hernia. Surgical consultation is recommended.   Discussed with supervising physician Dr. Anitra Lauth who is aware of patient. Consult placed to general surgery.   16:45: CONSULT: Discussed case with general surgeon Dr. Maisie Fus- requesting NG tube placement and ED to ED transfer to [facility].   17:05: CONSULT: Discussed with EDP Dr. Adela Lank- accepts transfer.  I discussed results and treatment plan with patient and his family at bedside. Provided opportunity for questions, patient confirmed understanding and is in agreement with plan.   Findings and plan of care discussed with supervising physician Dr. Anitra Lauth who personally evaluated and examined this patient and is in agreement with plan.    Final Clinical Impressions(s) / ED Diagnoses   Final diagnoses:  Small  bowel obstruction [facility])    ED Discharge Orders    None       Cherly Anderson, PA-C 01/18/18 2019    Gwyneth Sprout, MD 01/18/18 2359

## 2018-01-18 NOTE — ED Triage Notes (Signed)
Generalized abd pain and vomiting since this morning. Denies diarrhea.

## 2018-01-18 NOTE — ED Notes (Signed)
ED TO INPATIENT HANDOFF REPORT  Name/Age/Gender Colin Lara 67 y.o. male  Code Status   Home/SNF/Other Home  Chief Complaint VOMITING  Level of Care/Admitting Diagnosis ED Disposition    ED Disposition Condition [facility] [place] [100102]  Level of Care: Med-Surg [16]  Diagnosis: Small bowel obstruction [facility]) [[id number]]  Admitting Physician: CCS, Kennedy  Attending Physician: CCS, MD [[id number]]  PT Class (Do Not Modify): Observation [104]  PT Acc Code (Do Not Modify): Observation [10022]       Medical History Past Medical History:  Diagnosis Date  . Asthma   . Hypertension     Allergies Allergies  Allergen Reactions  . Penicillins   . Pollen Extract   . Statins     Other reaction(s): Other (See Comments) Myalgias Myalgias     IV Location/Drains/Wounds Patient Lines/Drains/Airways Status   Active Line/Drains/Airways    Name:   Placement date:   Placement time:   Site:   Days:   Peripheral IV 01/18/18 Right Forearm   01/18/18    1436    Forearm   less than 1   NG/OG Tube Nasogastric 14 Fr. Left nare Xray   01/18/18    1710    Left nare   less than 1          Labs/Imaging Results for orders placed or performed during the hospital encounter of 01/18/18 (from the past 48 hour(s))  Urinalysis, Routine w reflex microscopic     Status: Abnormal   Collection Time: 01/18/18  2:11 PM  Result Value Ref Range   Color, Urine YELLOW YELLOW   APPearance CLEAR CLEAR   Specific Gravity, Urine 1.025 1.005 - 1.030   pH 7.5 5.0 - 8.0   Glucose, UA NEGATIVE NEGATIVE mg/dL   Hgb urine dipstick NEGATIVE NEGATIVE   Bilirubin Urine NEGATIVE NEGATIVE   Ketones, ur NEGATIVE NEGATIVE mg/dL   Protein, ur 100 (A) NEGATIVE mg/dL   Nitrite NEGATIVE NEGATIVE   Leukocytes, UA NEGATIVE NEGATIVE    Comment: Performed at [facility], [address] [place], [place] [address]  Urinalysis, Microscopic (reflex)     Status:  Abnormal   Collection Time: 01/18/18  2:11 PM  Result Value Ref Range   RBC / HPF 0-5 0 - 5 RBC/hpf   WBC, UA 0-5 0 - 5 WBC/hpf   Bacteria, UA RARE (A) NONE SEEN   Squamous Epithelial / LPF 0-5 0 - 5   Mucus PRESENT    Hyaline Casts, UA PRESENT     Comment: Performed at [facility], [address] [place], [place] [address]  Lipase, blood     Status: Abnormal   Collection Time: 01/18/18  2:38 PM  Result Value Ref Range   Lipase 73 (H) 11 - 51 U/L    Comment: Performed at [facility], [address] [place], [place] [address]  Comprehensive metabolic panel     Status: Abnormal   Collection Time: 01/18/18  2:38 PM  Result Value Ref Range   Sodium 139 135 - 145 mmol/L   Potassium 4.2 3.5 - 5.1 mmol/L   Chloride 102 98 - 111 mmol/L   CO2 31 22 - 32 mmol/L   Glucose, Bld 135 (H) 70 - 99 mg/dL   BUN 14 8 - 23 mg/dL   Creatinine, Ser 0.96 0.61 - 1.24 mg/dL   Calcium 9.1 8.9 - 10.3 mg/dL   Total  Protein 7.8 6.5 - 8.1 g/dL   Albumin 4.2 3.5 - 5.0 g/dL   AST 26 15 - 41 U/L   ALT 19 0 - 44 U/L   Alkaline Phosphatase 69 38 - 126 U/L   Total Bilirubin 0.8 0.3 - 1.2 mg/dL   GFR calc non Af Amer >60 >60 mL/min   GFR calc Af Amer >60 >60 mL/min    Comment: (NOTE) The eGFR has been calculated using the CKD EPI equation. This calculation has not been validated in all clinical situations. eGFR's persistently <60 mL/min signify possible Chronic Kidney Disease.    Anion gap 6 5 - 15    Comment: Performed at [facility], [address] [place], [place] [address]  CBC     Status: Abnormal   Collection Time: 01/18/18  2:38 PM  Result Value Ref Range   WBC 10.6 (H) 4.0 - 10.5 K/uL   RBC 4.83 4.22 - 5.81 MIL/uL   Hemoglobin 14.5 13.0 - 17.0 g/dL   HCT 42.8 39.0 - 52.0 %   MCV 88.6 78.0 - 100.0 fL   MCH 30.0 26.0 - 34.0 pg   MCHC 33.9 30.0 - 36.0 g/dL   RDW 13.7 11.5 - 15.5 %   Platelets 331 150 - 400 K/uL    Comment: Performed at [facility], [address] [place], [place] [address]   Ct Abdomen Pelvis W Contrast  Result Date: 01/18/2018 CLINICAL DATA:  Abdominal pain EXAM: CT ABDOMEN AND PELVIS WITH CONTRAST TECHNIQUE: Multidetector CT imaging of the abdomen and pelvis was performed using the standard protocol following bolus administration of intravenous contrast. CONTRAST:  100 mL Isovue-300 COMPARISON:  None. FINDINGS: Lower chest: No acute abnormality. Hepatobiliary: Fatty infiltration of the liver is noted. The gallbladder is within normal limits. Pancreas: Unremarkable. No pancreatic ductal dilatation or surrounding inflammatory changes. Spleen: Normal in size without focal abnormality. Adrenals/Urinary Tract: Adrenal glands are within normal limits. The kidneys demonstrate normal enhancement pattern. No obstructive changes are seen. Bladder is partially decompressed. Stomach/Bowel: The colon is within normal limits. The appendix is well visualized. There are multiple dilated loops of small bowel identified in the left mid abdomen which is felt to represent an internal hernia with closed loop small bowel obstruction. The more distal small bowel is within normal limits. Transition zones are best visualized on image number 46 of series 2 of the axial plane and image 35 of series 5 in the coronal plane. Vascular/Lymphatic: Aortic atherosclerosis. No enlarged abdominal or pelvic lymph nodes. Reproductive: Prostate is unremarkable. Other: Mild amount of free fluid is noted within the pelvis. No evidence of free air is seen. Musculoskeletal: Degenerative changes of the lumbar spine are noted. IMPRESSION: Changes consistent with localized small-bowel obstruction in the left mid abdomen likely related to an internal hernia. Surgical consultation is recommended. Critical Value/emergent results were called by telephone at the time of interpretation on 01/18/2018 at 4:28 pm to [facility] PA, who verbally acknowledged these results.  Electronically Signed   By: Inez Catalina M.D.   On: 01/18/2018 16:32   Dg Abd Portable 1v  Result Date: 01/18/2018 CLINICAL DATA:  NG tube placement prior to transfer. EXAM: PORTABLE ABDOMEN - 1 VIEW COMPARISON:  CT scan 01/18/2018 FINDINGS: The NG tube is in the fundal region of the stomach. Contrast noted in the kidneys, ureters and bladder from the recent CT scan. Persistent mild small bowel dilatation in the left abdomen. No obvious free air.  IMPRESSION: The NG tube is in the fundal region of the stomach. Electronically Signed   By: Marijo Sanes M.D.   On: 01/18/2018 18:03    Pending Labs [employer] (From admission, onward)    Start     Ordered   Signed and Held  HIV antibody (Routine Testing)  Once,   R     Signed and Held   Signed and Held  CBC  (heparin)  Once,   R    Comments:  Baseline for heparin therapy IF NOT ALREADY DRAWN.  Notify MD if PLT < 100 K.    Signed and Held   Signed and Held  Creatinine, serum  (heparin)  Once,   R    Comments:  Baseline for heparin therapy IF NOT ALREADY DRAWN.    Signed and Held   Signed and Held  CBC WITH DIFFERENTIAL  Tomorrow morning,   R     Signed and Held   Signed and Held  Comprehensive metabolic panel  Tomorrow morning,   R     Signed and Held   Signed and Held  Lipase, blood  Tomorrow morning,   R     Signed and Held          Vitals/Pain Today's Vitals   01/18/18 1418 01/18/18 1421 01/18/18 1642 01/18/18 1820  BP: (!) 163/92  (!) 171/95 (!) 153/98  Pulse: 72  81   Resp: 20  18   Temp: 97.9 F (36.6 C)  97.8 F (36.6 C)   TempSrc: Oral  Oral   SpO2: 100%  96%   Weight: 78 kg     Height: 5' 10.5" (1.791 m)     PainSc:  8       Isolation Precautions No active isolations  Medications Medications  sodium chloride 0.9 % bolus 1,000 mL (0 mLs Intravenous Stopped 01/18/18 1612)  morphine 4 MG/ML injection 4 mg (4 mg Intravenous Given 01/18/18 1456)  ondansetron (ZOFRAN) injection 4 mg (4 mg Intravenous Given 01/18/18  1456)  iopamidol (ISOVUE-300) 61 % injection 100 mL (100 mLs Intravenous Contrast Given 01/18/18 1557)  iopamidol (ISOVUE-300) 61 % injection 30 mL (15 mLs Oral Contrast Given 01/18/18 1500)    Mobility walks

## 2018-01-18 NOTE — ED Notes (Signed)
Report called to [facility] ED charge nurse Stacy RN

## 2018-01-18 NOTE — ED Provider Notes (Addendum)
Patient is a [age]-year-old male presents with abdominal pain.  Transferred from med [facility] with diagnosis of small bowel obstruction.  Previous provider, Harvie Heck, PA-C, spoke with Dr. Maisie Fus with general surgery who advised NG tube and transfer to the ED for evaluation.  On my examination, patient is feeling much improved with NG tube in place.  His pain is 2/10 in his upper to mid abdomen, left-sided.  He denies any nausea or vomiting at this time.  Abdomen is soft with mild tenderness to the left upper quadrant and periumbilical region.  Heart sounds are normal, normal rate and rhythm, lungs are clear to auscultation.  6:30 PM I spoke with Dr. Daphine Deutscher with general surgery who will evaluate the patient and determine disposition.  After evaluation by Dr. Daphine Deutscher, patient will be admitted by general surgery who will assume patient's care.    Labs Reviewed  LIPASE, BLOOD - Abnormal; Notable for the following components:      Result Value   Lipase 73 (*)    All other components within normal limits  COMPREHENSIVE METABOLIC PANEL - Abnormal; Notable for the following components:   Glucose, Bld 135 (*)    All other components within normal limits  URINALYSIS, ROUTINE W REFLEX MICROSCOPIC - Abnormal; Notable for the following components:   Protein, ur 100 (*)    All other components within normal limits  CBC - Abnormal; Notable for the following components:   WBC 10.6 (*)    All other components within normal limits  URINALYSIS, MICROSCOPIC (REFLEX) - Abnormal; Notable for the following components:   Bacteria, UA RARE (*)    All other components within normal limits   DG Abd Portable 1V  Final Result    CT Abdomen Pelvis W Contrast  Final Result    Impression: Changes consistent with localized small-bowel obstruction in the left mid abdomen likely related to an internal hernia. Surgical consultation is recommended.     Emi Holes, PA-C 01/18/18  1950    Melene Plan, DO 01/18/18 2333

## 2018-01-18 NOTE — ED Notes (Signed)
ED Provider at bedside. 

## 2018-01-19 ENCOUNTER — Observation Stay (HOSPITAL_COMMUNITY): Payer: Medicare Other

## 2018-01-19 DIAGNOSIS — K56609 Unspecified intestinal obstruction, unspecified as to partial versus complete obstruction: Secondary | ICD-10-CM | POA: Diagnosis present

## 2018-01-19 DIAGNOSIS — E7800 Pure hypercholesterolemia, unspecified: Secondary | ICD-10-CM | POA: Diagnosis present

## 2018-01-19 DIAGNOSIS — Z888 Allergy status to other drugs, medicaments and biological substances status: Secondary | ICD-10-CM | POA: Diagnosis not present

## 2018-01-19 DIAGNOSIS — Z79899 Other long term (current) drug therapy: Secondary | ICD-10-CM | POA: Diagnosis not present

## 2018-01-19 DIAGNOSIS — K5651 Intestinal adhesions [bands], with partial obstruction: Secondary | ICD-10-CM | POA: Diagnosis present

## 2018-01-19 DIAGNOSIS — Z7951 Long term (current) use of inhaled steroids: Secondary | ICD-10-CM | POA: Diagnosis not present

## 2018-01-19 DIAGNOSIS — I10 Essential (primary) hypertension: Secondary | ICD-10-CM | POA: Diagnosis present

## 2018-01-19 DIAGNOSIS — Z91048 Other nonmedicinal substance allergy status: Secondary | ICD-10-CM | POA: Diagnosis not present

## 2018-01-19 DIAGNOSIS — K439 Ventral hernia without obstruction or gangrene: Secondary | ICD-10-CM | POA: Diagnosis present

## 2018-01-19 DIAGNOSIS — J45909 Unspecified asthma, uncomplicated: Secondary | ICD-10-CM | POA: Diagnosis present

## 2018-01-19 DIAGNOSIS — Z880 Allergy status to penicillin: Secondary | ICD-10-CM | POA: Diagnosis not present

## 2018-01-19 LAB — CBC WITH DIFFERENTIAL/PLATELET
BASOS PCT: 0 %
Basophils Absolute: 0 10*3/uL (ref 0.0–0.1)
EOS ABS: 0.1 10*3/uL (ref 0.0–0.7)
EOS PCT: 1 %
HCT: 40.4 % (ref 39.0–52.0)
Hemoglobin: 13.5 g/dL (ref 13.0–17.0)
Lymphocytes Relative: 15 %
Lymphs Abs: 1.5 10*3/uL (ref 0.7–4.0)
MCH: 29.9 pg (ref 26.0–34.0)
MCHC: 33.4 g/dL (ref 30.0–36.0)
MCV: 89.4 fL (ref 78.0–100.0)
MONO ABS: 0.6 10*3/uL (ref 0.1–1.0)
MONOS PCT: 6 %
Neutro Abs: 7.7 10*3/uL (ref 1.7–7.7)
Neutrophils Relative %: 78 %
Platelets: 373 10*3/uL (ref 150–400)
RBC: 4.52 MIL/uL (ref 4.22–5.81)
RDW: 14 % (ref 11.5–15.5)
WBC: 9.9 10*3/uL (ref 4.0–10.5)

## 2018-01-19 LAB — COMPREHENSIVE METABOLIC PANEL
ALBUMIN: 3.6 g/dL (ref 3.5–5.0)
ALT: 17 U/L (ref 0–44)
ANION GAP: 8 (ref 5–15)
AST: 20 U/L (ref 15–41)
Alkaline Phosphatase: 59 U/L (ref 38–126)
BUN: 10 mg/dL (ref 8–23)
CO2: 26 mmol/L (ref 22–32)
Calcium: 8.7 mg/dL — ABNORMAL LOW (ref 8.9–10.3)
Chloride: 105 mmol/L (ref 98–111)
Creatinine, Ser: 0.97 mg/dL (ref 0.61–1.24)
GFR calc non Af Amer: >60 mL/min (ref >60)
GLUCOSE: 139 mg/dL — AB (ref 70–99)
POTASSIUM: 3.6 mmol/L (ref 3.5–5.1)
SODIUM: 139 mmol/L (ref 135–145)
TOTAL PROTEIN: 7 g/dL (ref 6.5–8.1)
Total Bilirubin: 0.8 mg/dL (ref 0.3–1.2)

## 2018-01-19 LAB — LIPASE, BLOOD: Lipase: 24 U/L (ref 11–51)

## 2018-01-19 LAB — HIV ANTIBODY (ROUTINE TESTING W REFLEX): HIV Screen 4th Generation wRfx: NONREACTIVE

## 2018-01-19 LAB — LACTIC ACID, PLASMA: LACTIC ACID, VENOUS: 1.5 mmol/L (ref 0.5–1.9)

## 2018-01-19 MED ORDER — HYDRALAZINE HCL 20 MG/ML IJ SOLN
10.0000 mg | INTRAMUSCULAR | Status: DC | PRN
  Administered 2018-01-19: 10 mg via INTRAVENOUS
  Filled 2018-01-19: qty 1

## 2018-01-19 MED ORDER — DIATRIZOATE MEGLUMINE & SODIUM 66-10 % PO SOLN
90.0000 mL | Freq: Once | ORAL | Status: AC
  Administered 2018-01-19: 90 mL via NASOGASTRIC
  Filled 2018-01-19: qty 90

## 2018-01-19 NOTE — Plan of Care (Signed)
Pt resting in bed this morning with no complaints or concerns voiced. Will continue to monitor.

## 2018-01-19 NOTE — Progress Notes (Signed)
[facility] Progress Note     Subjective: CC:  abd pain slightly improved s/p NG decompression - still endorses mild, constant, aching pain in left abdomen along with intermittent cramping pains. Denies flatus or BM since admission. Denies history of abdominal surgery. Reports having a colonoscopy last year where he had one small polyp removed that was benign.   Objective: Vital signs in last 24 hours: Temp:  [97.8 F (36.6 C)-98.7 F (37.1 C)] 98.3 F (36.8 C) (09/11 0601) Pulse Rate:  [72-88] 79 (09/11 0601) Resp:  [18-20] 18 (09/11 0601) BP: (152-180)/(88-98) 180/94 (09/11 0601) SpO2:  [94 %-100 %] 96 % (09/11 0811) Weight:  [78 kg] 78 kg (09/10 1418)    Intake/Output from previous day: 09/10 0701 - 09/11 0700 In: 2208.6 [I.V.:1158.6; IV Piggyback:1050] Out: 800 [Urine:700; Emesis/NG output:100] Intake/Output this shift: No intake/output data recorded.  PE: Gen:  Alert, NAD, pleasant and cooperative  Card:  Regular rate and rhythm, pedal pulses 2+ BL Pulm:  Normal effort, clear to auscultation bilaterally Abd: Soft, mild distention, mild TTP LLQ, lower central abdomen, some voluntary guarding, no peritonitis, +BS, small reducible ventral hernia, ?some diastasis  Skin: warm and dry, no rashes  Psych: A&Ox3   Lab Results:  Recent Labs    01/18/18 1438 01/19/18 0450  WBC 10.6* 9.9  HGB 14.5 13.5  HCT 42.8 40.4  PLT 331 373   BMET Recent Labs    01/18/18 1438 01/19/18 0450  NA 139 139  K 4.2 3.6  CL 102 105  CO2 31 26  GLUCOSE 135* 139*  BUN 14 10  CREATININE 0.96 0.97  CALCIUM 9.1 8.7*   PT/INR No results for input(s): LABPROT, INR in the last 72 hours. CMP     Component Value Date/Time   NA 139 01/19/2018 0450   K 3.6 01/19/2018 0450   CL 105 01/19/2018 0450   CO2 26 01/19/2018 0450   GLUCOSE 139 (H) 01/19/2018 0450   BUN 10 01/19/2018 0450   CREATININE 0.97 01/19/2018 0450   CALCIUM 8.7 (L) 01/19/2018 0450   PROT 7.0 01/19/2018  0450   ALBUMIN 3.6 01/19/2018 0450   AST 20 01/19/2018 0450   ALT 17 01/19/2018 0450   ALKPHOS 59 01/19/2018 0450   BILITOT 0.8 01/19/2018 0450   GFRNONAA >60 01/19/2018 0450   GFRAA >60 01/19/2018 0450   Lipase     Component Value Date/Time   LIPASE 24 01/19/2018 0450       Studies/Results: Dg Abd 1 View  Result Date: 01/19/2018 CLINICAL DATA:  Small bowel obstruction.  NG tube position. EXAM: ABDOMEN - 1 VIEW COMPARISON:  Radiographs and CT yesterday. FINDINGS: Tip and side port of the enteric tube below the diaphragm in the stomach. Persistent gaseous distention of small bowel in the central abdomen. Some of the administered contrast may have reached the colon suggesting partial obstruction. Excreted IV contrast in the bladder. IMPRESSION: 1. Tip and side port of the enteric tube below the diaphragm in the stomach. 2. Persistent small bowel distention in the central abdomen. Administered enteric contrast is likely reached the ascending colon suggesting partial obstruction. Electronically Signed   By: Narda Rutherford M.D.   On: 01/19/2018 05:32   Ct Abdomen Pelvis W Contrast  Result Date: 01/18/2018 CLINICAL DATA:  Abdominal pain EXAM: CT ABDOMEN AND PELVIS WITH CONTRAST TECHNIQUE: Multidetector CT imaging of the abdomen and pelvis was performed using the standard protocol following bolus administration of intravenous contrast. CONTRAST:  100 mL Isovue-300  COMPARISON:  None. FINDINGS: Lower chest: No acute abnormality. Hepatobiliary: Fatty infiltration of the liver is noted. The gallbladder is within normal limits. Pancreas: Unremarkable. No pancreatic ductal dilatation or surrounding inflammatory changes. Spleen: Normal in size without focal abnormality. Adrenals/Urinary Tract: Adrenal glands are within normal limits. The kidneys demonstrate normal enhancement pattern. No obstructive changes are seen. Bladder is partially decompressed. Stomach/Bowel: The colon is within normal limits.  The appendix is well visualized. There are multiple dilated loops of small bowel identified in the left mid abdomen which is felt to represent an internal hernia with closed loop small bowel obstruction. The more distal small bowel is within normal limits. Transition zones are best visualized on image number 46 of series 2 of the axial plane and image 35 of series 5 in the coronal plane. Vascular/Lymphatic: Aortic atherosclerosis. No enlarged abdominal or pelvic lymph nodes. Reproductive: Prostate is unremarkable. Other: Mild amount of free fluid is noted within the pelvis. No evidence of free air is seen. Musculoskeletal: Degenerative changes of the lumbar spine are noted. IMPRESSION: Changes consistent with localized small-bowel obstruction in the left mid abdomen likely related to an internal hernia. Surgical consultation is recommended. Critical Value/emergent results were called by telephone at the time of interpretation on 01/18/2018 at 4:28 pm to [facility] PA, who verbally acknowledged these results. Electronically Signed   By: Alcide Clever M.D.   On: 01/18/2018 16:32   Dg Abd Portable 1v  Result Date: 01/18/2018 CLINICAL DATA:  NG tube placement prior to transfer. EXAM: PORTABLE ABDOMEN - 1 VIEW COMPARISON:  CT scan 01/18/2018 FINDINGS: The NG tube is in the fundal region of the stomach. Contrast noted in the kidneys, ureters and bladder from the recent CT scan. Persistent mild small bowel dilatation in the left abdomen. No obvious free air. IMPRESSION: The NG tube is in the fundal region of the stomach. Electronically Signed   By: Rudie Meyer M.D.   On: 01/18/2018 18:03    Anti-infectives: Anti-infectives (From admission, onward)   None     Assessment/Plan HTN - PRN hydralazine, resume home meds when NG is out  SBO  - small ventral hernia, no Hx CA, no PSH  - CT 9/10 suggests possible closed loop obstruction - remains clinically obstructed today, no gas/stool, but non-toxic,  afebrile ,VSS, lactate WNL  - small bowel protocol today   FEN: NPO, IVF, NGT to LIWS  ID: none VTE: SCD's, SQ heparin Foley: none    LOS: 0 days    Hosie Spangle, [facility] Surgery Pager: [phone]

## 2018-01-20 ENCOUNTER — Encounter (HOSPITAL_COMMUNITY): Admission: EM | Disposition: A | Discharge status: Home / Self Care | Payer: Self-pay

## 2018-01-20 ENCOUNTER — Inpatient Hospital Stay (HOSPITAL_COMMUNITY): Payer: Medicare Other

## 2018-01-20 ENCOUNTER — Inpatient Hospital Stay (HOSPITAL_COMMUNITY): Payer: Medicare Other | Admitting: Anesthesiology

## 2018-01-20 ENCOUNTER — Encounter (HOSPITAL_COMMUNITY): Payer: Self-pay | Admitting: Registered Nurse

## 2018-01-20 HISTORY — PX: LAPAROSCOPY: SHX197

## 2018-01-20 LAB — SURGICAL PCR SCREEN
MRSA, PCR: NEGATIVE
Staphylococcus aureus: NEGATIVE

## 2018-01-20 SURGERY — LAPAROSCOPY, DIAGNOSTIC
Anesthesia: General | Site: Abdomen

## 2018-01-20 MED ORDER — LACTATED RINGERS IV SOLN
INTRAVENOUS | Status: DC
  Administered 2018-01-20: 16:00:00 via INTRAVENOUS
  Administered 2018-01-20: 1000 mL via INTRAVENOUS

## 2018-01-20 MED ORDER — ROCURONIUM BROMIDE 10 MG/ML (PF) SYRINGE
PREFILLED_SYRINGE | INTRAVENOUS | Status: AC
  Filled 2018-01-20: qty 10

## 2018-01-20 MED ORDER — MIDAZOLAM HCL 5 MG/5ML IJ SOLN
INTRAMUSCULAR | Status: DC | PRN
  Administered 2018-01-20: 2 mg via INTRAVENOUS

## 2018-01-20 MED ORDER — FENTANYL CITRATE (PF) 250 MCG/5ML IJ SOLN
INTRAMUSCULAR | Status: DC | PRN
  Administered 2018-01-20: 100 ug via INTRAVENOUS
  Administered 2018-01-20: 50 ug via INTRAVENOUS
  Administered 2018-01-20: 100 ug via INTRAVENOUS

## 2018-01-20 MED ORDER — CLINDAMYCIN PHOSPHATE 900 MG/50ML IV SOLN
900.0000 mg | INTRAVENOUS | Status: AC
  Administered 2018-01-20: 900 mg via INTRAVENOUS
  Filled 2018-01-20: qty 50

## 2018-01-20 MED ORDER — SUGAMMADEX SODIUM 200 MG/2ML IV SOLN
INTRAVENOUS | Status: DC | PRN
  Administered 2018-01-20: 350 mg via INTRAVENOUS

## 2018-01-20 MED ORDER — ONDANSETRON HCL 4 MG/2ML IJ SOLN
INTRAMUSCULAR | Status: AC
  Filled 2018-01-20: qty 2

## 2018-01-20 MED ORDER — ROCURONIUM BROMIDE 10 MG/ML (PF) SYRINGE
PREFILLED_SYRINGE | INTRAVENOUS | Status: DC | PRN
  Administered 2018-01-20: 70 mg via INTRAVENOUS

## 2018-01-20 MED ORDER — PROPOFOL 10 MG/ML IV BOLUS
INTRAVENOUS | Status: DC | PRN
  Administered 2018-01-20: 70 mg via INTRAVENOUS
  Administered 2018-01-20 (×2): 10 mg via INTRAVENOUS

## 2018-01-20 MED ORDER — ONDANSETRON HCL 4 MG/2ML IJ SOLN
INTRAMUSCULAR | Status: DC | PRN
  Administered 2018-01-20: 4 mg via INTRAVENOUS

## 2018-01-20 MED ORDER — FENTANYL CITRATE (PF) 250 MCG/5ML IJ SOLN
INTRAMUSCULAR | Status: AC
  Filled 2018-01-20: qty 5

## 2018-01-20 MED ORDER — DEXAMETHASONE SODIUM PHOSPHATE 10 MG/ML IJ SOLN
INTRAMUSCULAR | Status: DC | PRN
  Administered 2018-01-20: 10 mg via INTRAVENOUS

## 2018-01-20 MED ORDER — SUGAMMADEX SODIUM 500 MG/5ML IV SOLN
INTRAVENOUS | Status: AC
  Filled 2018-01-20: qty 5

## 2018-01-20 MED ORDER — BUPIVACAINE-EPINEPHRINE 0.25% -1:200000 IJ SOLN
INTRAMUSCULAR | Status: DC | PRN
  Administered 2018-01-20: 7 mL

## 2018-01-20 MED ORDER — LIDOCAINE 2% (20 MG/ML) 5 ML SYRINGE
INTRAMUSCULAR | Status: DC | PRN
  Administered 2018-01-20: 60 mg via INTRAVENOUS

## 2018-01-20 MED ORDER — FENTANYL CITRATE (PF) 100 MCG/2ML IJ SOLN: 25.0000 ug | INTRAMUSCULAR | Status: DC | PRN

## 2018-01-20 MED ORDER — PHENYLEPHRINE 40 MCG/ML (10ML) SYRINGE FOR IV PUSH (FOR BLOOD PRESSURE SUPPORT)
PREFILLED_SYRINGE | INTRAVENOUS | Status: AC
  Filled 2018-01-20: qty 10

## 2018-01-20 MED ORDER — PROPOFOL 10 MG/ML IV BOLUS
INTRAVENOUS | Status: AC
  Filled 2018-01-20: qty 20

## 2018-01-20 MED ORDER — SODIUM CHLORIDE 0.9 % IR SOLN
Status: DC | PRN
  Administered 2018-01-20: 1000 mL

## 2018-01-20 MED ORDER — OXYCODONE HCL 5 MG/5ML PO SOLN
5.0000 mg | Freq: Once | ORAL | Status: DC | PRN
  Filled 2018-01-20: qty 5

## 2018-01-20 MED ORDER — PHENYLEPHRINE 40 MCG/ML (10ML) SYRINGE FOR IV PUSH (FOR BLOOD PRESSURE SUPPORT)
PREFILLED_SYRINGE | INTRAVENOUS | Status: DC | PRN
  Administered 2018-01-20 (×2): 80 ug via INTRAVENOUS

## 2018-01-20 MED ORDER — BUPIVACAINE-EPINEPHRINE (PF) 0.25% -1:200000 IJ SOLN
INTRAMUSCULAR | Status: AC
  Filled 2018-01-20: qty 30

## 2018-01-20 MED ORDER — KCL IN DEXTROSE-NACL 20-5-0.45 MEQ/L-%-% IV SOLN
INTRAVENOUS | Status: DC
  Administered 2018-01-20 – 2018-01-21 (×2): via INTRAVENOUS
  Filled 2018-01-20 (×2): qty 1000

## 2018-01-20 MED ORDER — TRAMADOL HCL 50 MG PO TABS
50.0000 mg | ORAL_TABLET | Freq: Four times a day (QID) | ORAL | Status: DC | PRN
  Administered 2018-01-21: 100 mg via ORAL
  Filled 2018-01-20: qty 2

## 2018-01-20 MED ORDER — MIDAZOLAM HCL 2 MG/2ML IJ SOLN
INTRAMUSCULAR | Status: AC
  Filled 2018-01-20: qty 2

## 2018-01-20 MED ORDER — LIDOCAINE 2% (20 MG/ML) 5 ML SYRINGE
INTRAMUSCULAR | Status: AC
  Filled 2018-01-20: qty 5

## 2018-01-20 MED ORDER — OXYCODONE HCL 5 MG PO TABS: 5.0000 mg | ORAL_TABLET | Freq: Once | ORAL | Status: DC | PRN

## 2018-01-20 MED ORDER — DEXAMETHASONE SODIUM PHOSPHATE 10 MG/ML IJ SOLN
INTRAMUSCULAR | Status: AC
  Filled 2018-01-20: qty 1

## 2018-01-20 SURGICAL SUPPLY — 43 items
APPLIER CLIP 5 13 M/L LIGAMAX5 (MISCELLANEOUS)
APPLIER CLIP ROT 10 11.4 M/L (STAPLE)
BLADE EXTENDED COATED 6.5IN (ELECTRODE) IMPLANT
CLIP APPLIE 5 13 M/L LIGAMAX5 (MISCELLANEOUS) IMPLANT
CLIP APPLIE ROT 10 11.4 M/L (STAPLE) IMPLANT
COVER MAYO STAND STRL (DRAPES) ×3 IMPLANT
DECANTER SPIKE VIAL GLASS SM (MISCELLANEOUS) ×3 IMPLANT
DERMABOND ADVANCED (GAUZE/BANDAGES/DRESSINGS)
DERMABOND ADVANCED .7 DNX12 (GAUZE/BANDAGES/DRESSINGS) IMPLANT
ELECT PENCIL ROCKER SW 15FT (MISCELLANEOUS) IMPLANT
ELECT REM PT RETURN 15FT ADLT (MISCELLANEOUS) ×3 IMPLANT
GAUZE SPONGE 4X4 12PLY STRL (GAUZE/BANDAGES/DRESSINGS) ×3 IMPLANT
GLOVE BIO SURGEON STRL SZ 6.5 (GLOVE) ×4 IMPLANT
GLOVE BIO SURGEONS STRL SZ 6.5 (GLOVE) ×2
GLOVE BIOGEL PI IND STRL 7.0 (GLOVE) ×1 IMPLANT
GLOVE BIOGEL PI INDICATOR 7.0 (GLOVE) ×2
GOWN STRL REUS W/TWL 2XL LVL3 (GOWN DISPOSABLE) ×3 IMPLANT
GOWN STRL REUS W/TWL XL LVL3 (GOWN DISPOSABLE) ×6 IMPLANT
HANDLE SUCTION POOLE (INSTRUMENTS) IMPLANT
IRRIG SUCT STRYKERFLOW 2 WTIP (MISCELLANEOUS) ×3
IRRIGATION SUCT STRKRFLW 2 WTP (MISCELLANEOUS) ×1 IMPLANT
PACK COLON (CUSTOM PROCEDURE TRAY) ×3 IMPLANT
SEALER TISSUE G2 STRG ARTC 35C (ENDOMECHANICALS) IMPLANT
SLEEVE XCEL OPT CAN 5 100 (ENDOMECHANICALS) ×3 IMPLANT
SPONGE LAP 18X18 RF (DISPOSABLE) IMPLANT
STAPLER VISISTAT 35W (STAPLE) IMPLANT
SUCTION POOLE HANDLE (INSTRUMENTS)
SUT NOVA 1 T20/GS 25DT (SUTURE) IMPLANT
SUT PDS AB 1 TP1 96 (SUTURE) IMPLANT
SUT PROLENE 2 0 KS (SUTURE) IMPLANT
SUT PROLENE 2 0 SH DA (SUTURE) IMPLANT
SUT SILK 2 0 (SUTURE) ×2
SUT SILK 2 0 SH CR/8 (SUTURE) ×3 IMPLANT
SUT SILK 2-0 18XBRD TIE 12 (SUTURE) ×1 IMPLANT
SUT SILK 3 0 (SUTURE) ×2
SUT SILK 3 0 SH CR/8 (SUTURE) ×3 IMPLANT
SUT SILK 3-0 18XBRD TIE 12 (SUTURE) ×1 IMPLANT
TOWEL OR 17X26 10 PK STRL BLUE (TOWEL DISPOSABLE) IMPLANT
TOWEL OR NON WOVEN STRL DISP B (DISPOSABLE) ×3 IMPLANT
TRAY FOLEY MTR SLVR 16FR STAT (SET/KITS/TRAYS/PACK) ×3 IMPLANT
TROCAR BLADELESS OPT 5 100 (ENDOMECHANICALS) ×3 IMPLANT
TROCAR XCEL BLUNT TIP 100MML (ENDOMECHANICALS) ×3 IMPLANT
TROCAR XCEL NON-BLD 11X100MML (ENDOMECHANICALS) IMPLANT

## 2018-01-20 NOTE — Progress Notes (Signed)
SBO (small bowel obstruction) (HCC)  Subjective: Feels about the same, no flatus  Objective: Vital signs in last 24 hours: Temp:  [98.9 F (37.2 C)-99 F (37.2 C)] 98.9 F (37.2 C) (09/12 [id number]) Pulse Rate:  [79-89] 80 (09/12 0633) Resp:  [16-18] 18 (09/12 0633) BP: (166-184)/(92-101) 166/97 (09/12 0633) SpO2:  [96 %-98 %] 97 % (09/12 0633)    Intake/Output from previous day: 09/11 0701 - 09/12 0700 In: 2769.9 [I.V.:2669.9; IV Piggyback:100] Out: 2800 [Urine:2350; Emesis/NG output:450] Intake/Output this shift: No intake/output data recorded.  General appearance: alert and cooperative GI: normal findings: distended, generalized mild TTP  Lab Results:  No results found for this or any previous visit (from the past 24 hour(s)).   Studies/Results Radiology     MEDS, Scheduled . fluticasone furoate-vilanterol  1 puff Inhalation Daily  . heparin  5,000 Units Subcutaneous Q8H     Assessment: SBO (small bowel obstruction) (HCC)  AXR looks worse  Plan: Pt has not progressed after medical management.  I have recommended a diagnostic laparoscopy with LOA.  We discussed the possible need for open surgery and possibly closing his ventral hernia.  We discussed the need for possible bowel resection as well.  Other risks include bleeding, infection, damage to adjacent structures, hernia, recurrence and wound infection.  I believe he understands this and wishes to proceed.     LOS: 1 day    Vanita PandaAlicia C Larue Lightner, MD [facility], [place] [phone]   01/20/2018 8:45 AM

## 2018-01-20 NOTE — Op Note (Signed)
01/18/2018 - 01/20/2018  3:25 PM  PATIENT:  Colin Lara  67 y.o. male  Patient Care Team: Smothers, Cathleen Cortieborah N, NP as PCP - General (Nurse Practitioner)  PRE-OPERATIVE DIAGNOSIS:  Small bowel obstruction  POST-OPERATIVE DIAGNOSIS:  Small bowel obstruction  PROCEDURE:  DIAGNOSTIC LAPAROSCOPY, LYSIS OF ADHESIONS    Surgeon(s): Romie Leveehomas, Betul Brisky, MD  ASSISTANT: Barnetta ChapelKelly Osborne, PA   ANESTHESIA:   local and general  EBL: 5ml  Total I/O In: 607.2 [I.V.:607.2] Out: 400 [Urine:300; Emesis/NG output:100]  DRAINS: none   SPECIMEN:  No Specimen  DISPOSITION OF SPECIMEN:  N/A  COUNTS:  YES  PLAN OF CARE: Pt already admitted  PATIENT DISPOSITION:  PACU - hemodynamically stable.  INDICATION: 67 y.o. M with no PSH and new SBO.  We attempted NG placement and conservative management for approximately 48 hours but there was no change in symptoms.   OR FINDINGS: Adhesive band of omentum to the sigmoid colon causing partial small bowel obstruction.  DESCRIPTION: the patient was identified in the preoperative holding area and taken to the OR where they were laid supine on the operating room table.  General anesthesia was induced without difficulty. SCDs were also noted to be in place prior to the initiation of anesthesia.  The patient was then prepped and draped in the usual sterile fashion.   A surgical timeout was performed indicating the correct patient, procedure, positioning and need for preoperative antibiotics.   I made a supraumbilical incision using a 15 blade scalpel.  Dissection was carried down to the fascia bluntly.  The fascia was elevated using 2 Coker clamps and incised at midline.  The peritoneum was entered bluntly with a Kelly clamp.  A pursestring 0 Vicryl suture was placed and a Hassan port was introduced into the abdomen and secured.  The abdomen was insufflated to approximately 15 mmHg.  I then placed two 5 mm ports in the left lower and left upper quadrant.  Identified  the cecum and then ran the small bowel proximally until the adhesion was identified.  There was a piece of omentum that was adherent to an epiploic appendage of the sigmoid colon.  This was lysed with scissors.  I then continued to run the bowel all the way to the ligament of Treitz and found no other adhesions.  The bowel was decompressed.  I identified the right colon and evaluated this in the transverse colon.  There was no other pathology noted.  The sigmoid colon descending colon appeared to be normal as well.  At this point the abdomen was desufflated and the ports were removed.  The midline incision was closed with a pursestring suture.  The skin of all 3 incisions was then closed with 4-0 Monocryl suture.  The patient was then awakened from anesthesia and sent to the postanesthesia care unit stable condition.  All counts were correct per operating room staff.  Vanita PandaAlicia C Elius Etheredge, MD  Colorectal and General Surgery [facility]

## 2018-01-20 NOTE — Anesthesia Preprocedure Evaluation (Signed)
Anesthesia Evaluation  Patient identified by MRN, date of birth, ID band Patient awake    Reviewed: Allergy & Precautions, NPO status , Patient's Chart, lab work & pertinent test results  History of Anesthesia Complications Negative for: history of anesthetic complications  Airway Mallampati: II  TM Distance: >3 FB Neck ROM: Full    Dental  (+) Upper Dentures, Lower Dentures   Pulmonary asthma ,    breath sounds clear to auscultation       Cardiovascular hypertension, Pt. on medications  Rhythm:Regular     Neuro/Psych negative neurological ROS  negative psych ROS   GI/Hepatic Neg liver ROS, Small bowel obstruction   Endo/Other  negative endocrine ROS  Renal/GU negative Renal ROS     Musculoskeletal negative musculoskeletal ROS (+)   Abdominal   Peds  Hematology negative hematology ROS (+)   Anesthesia Other Findings   Reproductive/Obstetrics                             Anesthesia Physical Anesthesia Plan  ASA: II  Anesthesia Plan: General   Post-op Pain Management:    Induction: Intravenous  PONV Risk Score and Plan: 2 and Ondansetron and Dexamethasone  Airway Management Planned: Oral ETT  Additional Equipment: None  Intra-op Plan:   Post-operative Plan: Extubation in OR  Informed Consent: I have reviewed the patients History and Physical, chart, labs and discussed the procedure including the risks, benefits and alternatives for the proposed anesthesia with the patient or authorized representative who has indicated his/her understanding and acceptance.   Dental advisory given  Plan Discussed with: CRNA and Surgeon  Anesthesia Plan Comments:         Anesthesia Quick Evaluation

## 2018-01-20 NOTE — Transfer of Care (Signed)
Immediate Anesthesia Transfer of Care Note  Patient: Colin Lara  Procedure(s) Performed: DIAGNOSTIC LAPAROSCOPY, LYSIS OF ADHESIONS (N/A Abdomen)  Patient Location: PACU  Anesthesia Type:General  Level of Consciousness: sedated  Airway & Oxygen Therapy: Patient Spontanous Breathing and Patient connected to face mask oxygen  Post-op Assessment: Report given to RN and Post -op Vital signs reviewed and stable  Post vital signs: Reviewed and stable  Last Vitals:  Vitals Value Taken Time  BP    Temp    Pulse    Resp    SpO2      Last Pain:  Vitals:   01/20/18 1357  TempSrc:   PainSc: 0-No pain      Patients Stated Pain Goal: 4 (01/20/18 1357)  Complications: No apparent anesthesia complications

## 2018-01-20 NOTE — Anesthesia Procedure Notes (Signed)
Procedure Name: Intubation Date/Time: 01/20/2018 2:49 PM Performed by: Talbot Grumbling, CRNA Pre-anesthesia Checklist: Patient identified, Emergency Drugs available, Patient being monitored and Suction available Patient Re-evaluated:Patient Re-evaluated prior to induction Oxygen Delivery Method: Circle system utilized Preoxygenation: Pre-oxygenation with 100% oxygen Induction Type: IV induction Ventilation: Mask ventilation without difficulty Laryngoscope Size: Mac and 3 Grade View: Grade I Tube type: Oral Tube size: 7.0 mm Number of attempts: 1 Airway Equipment and Method: Stylet Placement Confirmation: ETT inserted through vocal cords under direct vision,  positive ETCO2 and breath sounds checked- equal and bilateral Secured at: 22 cm Tube secured with: Tape Dental Injury: Teeth and Oropharynx as per pre-operative assessment

## 2018-01-21 ENCOUNTER — Encounter (HOSPITAL_COMMUNITY): Payer: Self-pay | Admitting: General Surgery

## 2018-01-21 MED ORDER — TRAMADOL HCL 50 MG PO TABS: 50.0000 mg | ORAL_TABLET | Freq: Four times a day (QID) | ORAL | 0 refills | Status: AC | PRN

## 2018-01-21 NOTE — Discharge Instructions (Signed)
Please arrive at least 30 min before your appointment to complete your check in paperwork.  If you are unable to arrive 30 min prior to your appointment time we may have to cancel or reschedule you.  LAPAROSCOPIC SURGERY: POST OP INSTRUCTIONS  1. DIET: Follow a light bland diet the first 24 hours after arrival home, such as soup, liquids, crackers, etc. Be sure to include lots of fluids daily. Avoid fast food or heavy meals as your are more likely to get nauseated. Eat a low fat the next few days after surgery.  2. Take your usually prescribed home medications unless otherwise directed. 3. PAIN CONTROL:  1. Pain is best controlled by a usual combination of three different methods TOGETHER:  i. Ice/Heat ii. Over the counter pain medication iii. Prescription pain medication 2. Most patients will experience some swelling and bruising around the incisions. Ice packs or heating pads (30-60 minutes up to 6 times a day) will help. Use ice for the first few days to help decrease swelling and bruising, then switch to heat to help relax tight/sore spots and speed recovery. Some people prefer to use ice alone, heat alone, alternating between ice & heat. Experiment to what works for you. Swelling and bruising can take several weeks to resolve.  3. It is helpful to take an over-the-counter pain medication regularly for the first few weeks. Choose one of the following that works best for you:  i. Naproxen (Aleve, etc) Two 220mg  tabs twice a day ii. Ibuprofen (Advil, etc) Three 200mg  tabs four times a day (every meal & bedtime) iii. Acetaminophen (Tylenol, etc) 500-650mg  four times a day (every meal & bedtime) 4. A prescription for pain medication (such as oxycodone, hydrocodone, etc) should be given to you upon discharge. Take your pain medication as prescribed.  i. If you are having problems/concerns with the prescription medicine (does not control pain, nausea, vomiting, rash, itching, etc), please call us [phone]  [phone] to see if we need to switch you to a different pain medicine that will work better for you and/or control your side effect better. ii. If you need a refill on your pain medication, please contact your pharmacy. They will contact our office to request authorization. Prescriptions will not be filled after 5 pm or on week-ends. 1. Avoid getting constipated. Between the surgery and the pain medications, it is common to experience some constipation. Increasing fluid intake and taking a fiber supplement (such as Metamucil, Citrucel, FiberCon, MiraLax, etc) 1-2 times a day regularly will usually help prevent this problem from occurring. A mild laxative (prune juice, Milk of Magnesia, MiraLax, etc) should be taken according to package directions if there are no bowel movements after 48 hours.  2. Watch out for diarrhea. If you have many loose bowel movements, simplify your diet to bland foods & liquids for a few days. Stop any stool softeners and decrease your fiber supplement. Switching to mild anti-diarrheal medications (Kayopectate, Pepto Bismol) can help. If this worsens or does not improve, please call us. 3. Wash / shower every day. You may shower over the dressings as they are waterproof. Continue to shower over incision(s) after the dressing is off. 4. Remove your waterproof bandages 5 days after surgery. You may leave the incision open to air. You may replace a dressing/Band-Aid to cover the incision for comfort if you wish.  5. ACTIVITIES as tolerated:  a. You may resume regular (light) daily activities beginning the next day--such as daily self-care, walking, climbing stairs--gradually  increasing activities as tolerated. If you can walk 30 minutes without difficulty, it is safe to try more intense activity such as jogging, treadmill, bicycling, low-impact aerobics, swimming, etc. °b. Save the most intensive and strenuous activity for last such as sit-ups, heavy lifting, contact sports, etc Refrain  from any heavy lifting or straining until you are off narcotics for pain control.  °c. DO NOT PUSH THROUGH PAIN. Let pain be your guide: If it hurts to do something, don't do it. Pain is your body warning you to avoid that activity for another week until the pain goes down. °d. You may drive when you are no longer taking prescription pain medication, you can comfortably wear a seatbelt, and you can safely maneuver your car and apply brakes. °e. You may have sexual intercourse when it is comfortable.  °6. FOLLOW UP in our office  °a. Please call [facility] at [phone] to set up an appointment to see your surgeon in the office for a follow-up appointment approximately 2-3 weeks after your surgery. °b. Make sure that you call for this appointment the day you arrive home to insure a convenient appointment time. °     10. IF YOU HAVE DISABILITY OR FAMILY LEAVE FORMS, BRING THEM TO THE               OFFICE FOR PROCESSING.  ° °WHEN TO CALL US [phone]:  °1. Poor pain control °2. Reactions / problems with new medications (rash/itching, nausea, etc)  °3. Fever over 101.5 F (38.5 C) °4. Inability to urinate °5. Nausea and/or vomiting °6. Worsening swelling or bruising °7. Continued bleeding from incision. °8. Increased pain, redness, or drainage from the incision ° °The clinic staff is available to answer your questions during regular business hours (8:30am-5pm). Please don’t hesitate to call and ask to speak to one of our nurses for clinical concerns.  °If you have a medical emergency, go to the nearest emergency room or call 911.  °A surgeon from [facility] is always on call at the hospitals  ° °[facility]  °[address], [address], [place], [place] [address] ?  °MAIN: [phone] ? TOLL FREE: [phone] ?  °FAX [phone]  °[email or web address] ° °

## 2018-01-21 NOTE — Progress Notes (Signed)
1 Day Post-Op Lap LOA Subjective: Felling well, passing flatus  Objective: Vital signs in last 24 hours: Temp:  [97.4 F (36.3 C)-98.4 F (36.9 C)] 98.2 F (36.8 C) (09/13 0530) Pulse Rate:  [77-88] 77 (09/13 0530) Resp:  [12-18] 14 (09/13 0530) BP: (143-166)/(85-99) 154/92 (09/13 0530) SpO2:  [94 %-100 %] 95 % (09/13 0530) Weight:  [77.1 kg] 77.1 kg (09/12 1357)   Intake/Output from previous day: 09/12 0701 - 09/13 0700 In: 3675.8 [P.O.:650; I.V.:2875.8; IV Piggyback:150] Out: 2370 [Urine:2250; Emesis/NG output:100; Blood:20] Intake/Output this shift: No intake/output data recorded.   General appearance: alert and cooperative GI: normal findings: soft, non-tender  Incision: no significant drainage  Lab Results:  Recent Labs    01/18/18 1438 01/19/18 0450  WBC 10.6* 9.9  HGB 14.5 13.5  HCT 42.8 40.4  PLT 331 373   BMET Recent Labs    01/18/18 1438 01/19/18 0450  NA 139 139  K 4.2 3.6  CL 102 105  CO2 31 26  GLUCOSE 135* 139*  BUN 14 10  CREATININE 0.96 0.97  CALCIUM 9.1 8.7*   PT/INR No results for input(s): LABPROT, INR in the last 72 hours. ABG No results for input(s): PHART, HCO3 in the last 72 hours.  Invalid input(s): PCO2, PO2  MEDS, Scheduled . fluticasone furoate-vilanterol  1 puff Inhalation Daily  . heparin  5,000 Units Subcutaneous Q8H    Studies/Results: Dg Abd 1 View  Result Date: 01/19/2018 CLINICAL DATA:  NG tube adjustment EXAM: ABDOMEN - 1 VIEW COMPARISON:  01/19/2018, 01/18/2018, CT 01/18/2018 FINDINGS: Esophageal tube tip overlies the GE junction. Dilated central small bowel with small amount of residual contrast in the bowel. Stool in the colon. IMPRESSION: Esophageal tube tip overlies the GE junction, suggest further advancement for more optimal positioning. Electronically Signed   By: Jasmine PangKim  Fujinaga M.D.   On: 01/19/2018 14:51   Dg Abd Portable 1v-small Bowel Obstruction Protocol-initial, 8 Hr Delay  Result Date:  01/20/2018 CLINICAL DATA:  Small-bowel obstruction, follow-up EXAM: PORTABLE ABDOMEN - 1 VIEW COMPARISON:  Portable exam 0217 hours compared to prior exams from 01/19/2018 FINDINGS: Small amount of residual contrast material is identified in the RIGHT lower quadrant, likely within cecum. Persistent significant distention of small bowel loops in the mid abdomen up to 4.4 cm diameter. Stool noted at RIGHT colon and pack flexure, minimally more distally in colon. No definite bowel wall thickening. Osseous structures unremarkable. Nasogastric tube coiled in proximal stomach. IMPRESSION: Persistent small bowel distention. Electronically Signed   By: Ulyses SouthwardMark  Boles M.D.   On: 01/20/2018 03:15   Dg Abd Portable 1v  Result Date: 01/19/2018 CLINICAL DATA:  Small bowel obstruction. EXAM: PORTABLE ABDOMEN - 1 VIEW COMPARISON:  Earlier same day film at 0446 hours. FINDINGS: Film at 1049 hours. Overall similar appearance of gaseous bowel distention. Small bowel loops in the central abdomen are similar to prior. There is contrast material overlying the right abdomen without substantial change in position, but likely in the right colon. IMPRESSION: No substantial interval change in the 6 hours since the prior study. Electronically Signed   By: Kennith CenterEric  Mansell M.D.   On: 01/19/2018 12:14    Assessment: s/p Procedure(s): DIAGNOSTIC LAPAROSCOPY, LYSIS OF ADHESIONS Patient Active Problem List   Diagnosis Date Noted  . SBO (small bowel obstruction) (HCC) 01/18/2018  . Small bowel obstruction (HCC) 01/18/2018    Expected post op course  Plan: Advance diet as tolerated Possible d/c later today or tomorrow   LOS: 2 days     .  Vanita Panda, MD [facility] [place] [phone]   01/21/2018 9:07 AM

## 2018-01-21 NOTE — Discharge Summary (Signed)
    Patient ID: Colin Lara [id number] 06-Apr-1951 67 y.o.  Admit date: 01/18/2018 Discharge date: 01/21/2018  Admitting Diagnosis: SBO  Discharge Diagnosis Patient Active Problem List   Diagnosis Date Noted  . SBO (small bowel obstruction) (HCC) 01/18/2018  . Small bowel obstruction (HCC) 01/18/2018    Consultants none  Reason for Admission: Colin Lara is an 67 y.o. male [facility] after drinking orange juice this morning and having acute onset of abdominal pain nausea and vomiting.  He had no prodrome or problems during the night.  He had a fairly bland dinner.  He is never had any prior surgery.  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  When [facility] was obtained which showed dilated loops of small bowel in the left upper quadrant.  He also had stool in the  right side.  An NG tube was placed at that facility and since placement he has had output and marked relief of his discomfort.  He appears stable at the present time.  Procedures Diagnostic laparoscopy with lysis of adhesions  Hospital Course:  The patient was admitted and an NGT was placed.  He was treated conservatively for a couple of days with no improvement in his abdominal films.  He was ultimately taken to the OR where he underwent the above procedure.  He was found to have a one band adhesion that was lysed.  He tolerated this well.  His diet was able to be advanced quickly after surgery with return of bowel function.  On POD 1, he was tolerating full liquids, having bowel function, with good pain control.  He was otherwise stable for DC home.  Physical Exam: Please see note from earlier today.  Allergies as of 01/21/2018      Reactions   Penicillins    Pollen Extract    Statins    Other reaction(s): Other (See Comments) Myalgias Myalgias      Medication  List    STOP taking these medications   naproxen 375 MG tablet Commonly known as:  NAPROSYN     TAKE these medications   albuterol (2.5 MG/3ML) 0.083% nebulizer solution Commonly known as:  PROVENTIL Take 2.5 mg by nebulization every 6 (six) hours as needed for wheezing or shortness of breath.   BREO ELLIPTA 200-25 MCG/INH Aepb Generic drug:  fluticasone furoate-vilanterol Inhale 1 puff into the lungs daily.   ezetimibe 10 MG tablet Commonly known as:  ZETIA Take 10 mg by mouth daily.   lisinopril-hydrochlorothiazide 10-12.5 MG tablet Commonly known as:  PRINZIDE,ZESTORETIC Take 1 tablet by mouth daily.   traMADol 50 MG tablet Commonly known as:  ULTRAM Take 1 tablet (50 mg total) by mouth every 6 (six) hours as needed for moderate pain or severe pain. What changed:    how much to take  reasons to take this  additional instructions        Follow-up Information    Romie Leveehomas, Alicia, MD Follow up in 3 week(s).   Specialty:  General Surgery Why:  our office will call you with appointment date and time Contact information: [address] [place] [address] [phone]           Signed: Barnetta ChapelKelly [place] Dani, [facility] 01/21/2018, 4:32 PM Pager: [phone]

## 2018-01-21 NOTE — Progress Notes (Signed)
Pt alert and oriented.  Tolerated diet well.  Family at bedside. Discharge teaching to pt and family, all questions answered, pt discharged.  4:55 PM Acie FredricksonHaley E Sandy Blouch Edwin DadaHaley E Corderro Koloski 01/21/18

## 2018-01-24 NOTE — Anesthesia Postprocedure Evaluation (Signed)
Anesthesia Post Note  Patient: Colin Lara  Procedure(s) Performed: DIAGNOSTIC LAPAROSCOPY, LYSIS OF ADHESIONS (N/A Abdomen)     Patient location during evaluation: PACU Anesthesia Type: General Level of consciousness: awake and alert Pain management: pain level controlled Vital Signs Assessment: post-procedure vital signs reviewed and stable Respiratory status: spontaneous breathing, nonlabored ventilation, respiratory function stable and patient connected to nasal cannula oxygen Cardiovascular status: blood pressure returned to baseline and stable Postop Assessment: no apparent nausea or vomiting Anesthetic complications: no    Last Vitals:  Vitals:   01/21/18 0948 01/21/18 1339  BP: (!) 151/87 (!) 156/91  Pulse: 78 82  Resp: 18 18  Temp: 36.7 C 36.6 C  SpO2: 95% 96%    Last Pain:  Vitals:   01/21/18 1339  TempSrc: Oral  PainSc:                  [name]

## 2018-04-06 ENCOUNTER — Other Ambulatory Visit: Payer: Self-pay

## 2018-04-06 ENCOUNTER — Emergency Department (HOSPITAL_BASED_OUTPATIENT_CLINIC_OR_DEPARTMENT_OTHER): Payer: Medicare Other

## 2018-04-06 ENCOUNTER — Emergency Department (HOSPITAL_BASED_OUTPATIENT_CLINIC_OR_DEPARTMENT_OTHER)
Admission: EM | Admit: 2018-04-06 | Discharge: 2018-04-06 | Disposition: A | Discharge status: Home / Self Care | Payer: Medicare Other | Attending: Emergency Medicine | Admitting: Emergency Medicine

## 2018-04-06 ENCOUNTER — Encounter (HOSPITAL_BASED_OUTPATIENT_CLINIC_OR_DEPARTMENT_OTHER): Payer: Self-pay

## 2018-04-06 DIAGNOSIS — I10 Essential (primary) hypertension: Secondary | ICD-10-CM | POA: Diagnosis not present

## 2018-04-06 DIAGNOSIS — Z79899 Other long term (current) drug therapy: Secondary | ICD-10-CM | POA: Insufficient documentation

## 2018-04-06 DIAGNOSIS — M25551 Pain in right hip: Secondary | ICD-10-CM | POA: Insufficient documentation

## 2018-04-06 DIAGNOSIS — J45909 Unspecified asthma, uncomplicated: Secondary | ICD-10-CM | POA: Insufficient documentation

## 2018-04-06 MED ORDER — METHOCARBAMOL 500 MG PO TABS: 500.0000 mg | ORAL_TABLET | Freq: Three times a day (TID) | ORAL | 0 refills | Status: AC | PRN

## 2018-04-06 MED ORDER — ACETAMINOPHEN 325 MG PO TABS
650.0000 mg | ORAL_TABLET | Freq: Once | ORAL | Status: AC
  Administered 2018-04-06: 650 mg via ORAL
  Filled 2018-04-06: qty 2

## 2018-04-06 MED ORDER — DICLOFENAC SODIUM 1 % TD GEL: 2.0000 g | Freq: Four times a day (QID) | TRANSDERMAL | 0 refills | Status: AC

## 2018-04-06 NOTE — Discharge Instructions (Signed)
You were seen in the ER today for back/hip pain. 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 We are sending you home with 2 prescriptions to help with your symptoms:  - Voltaren gel- this is an anti-inflammatory gel that you may apply to the hip 4 times per day, please do not use this for more than 5 days.  - Robaxin is the muscle relaxer I have prescribed, this is meant to help with muscle tightness. Be aware that this medication may make you drowsy therefore the first time you take this it should be at a time you are in an environment where you can rest. Do not drive or operate heavy machinery when taking this medication. Do not drink alcohol or take other sedating medications with this medicine such as narcotics or benzodiazepines.   You make take Tylenol per over the counter dosing with these medications.   We have prescribed you new medication(s) today. Discuss the medications prescribed today with your pharmacist as they can have adverse effects and interactions with your other medicines including over the counter and prescribed medications. Seek medical evaluation if you start to experience new or abnormal symptoms after taking one of these medicines, seek care immediately if you start to experience difficulty breathing, feeling of your throat closing, facial swelling, or rash as these could be indications of a more serious allergic reaction   Please follow up with your primary care provider within 5 days for re-evaluation as well as for a recheck of your blood pressure as this was elevated in the ER today. We have also given you information for Dr. Pearletha ForgeHudnall, a sports medicine doctor, that you may follow up with for your pain as well. Return to the ER for new or worsening symptoms or any other concerns.

## 2018-04-06 NOTE — ED Provider Notes (Signed)
[facility] EMERGENCY DEPARTMENT Provider Note   CSN: [id number] Arrival date & time: 04/06/18  1206     History   Chief Complaint Hip pain  HPI Colin Lara is a 67 y.o. male with a hx of asthma, HTN, and prior SBO who presents to the ED with complaints of right hip pain s/p heavy lifting and reaching movements. Patient states that 2 days prior he was attempting to lift a heavy commode and developed a pain in the right lower back/buttocks area, subsequently the next day (yesterday) he reached up while doing some house work and felt a pulling/increased pain to the R posterolateral hip. He states he had no falls or traumatic injuries associated with either of these events. Reports pain is now mostly to the posterolateral hip, constant, worse with movements/position changes, no alleviating factors, tried advil without much change. He relays that the pain will shoot down the leg with certain movements/position changes. Current discomfort is a 4-5/10 in severity. Denies numbness, tingling, weakness, saddle anesthesia, incontinence to bowel/bladder, fever, chills, IV drug use, dysuria, or hx of cancer. Patient has not had prior back/hip surgeries.    HPI  Past Medical History:  Diagnosis Date  . Asthma   . Hypertension     Patient Active Problem List   Diagnosis Date Noted  . SBO (small bowel obstruction) (HCC) 01/18/2018  . Small bowel obstruction (HCC) 01/18/2018    Past Surgical History:  Procedure Laterality Date  . LAPAROSCOPY N/A 01/20/2018   Procedure: DIAGNOSTIC LAPAROSCOPY, LYSIS OF ADHESIONS;  Surgeon: Romie Leveehomas, Alicia, MD;  Location: [facility];  Service: General;  Laterality: N/A;        Home Medications    Prior to Admission medications   Medication Sig Start Date End Date Taking? Authorizing Provider  albuterol (PROVENTIL) (2.5 MG/3ML) 0.083% nebulizer solution Take 2.5 mg by nebulization every 6 (six) hours as needed for wheezing or shortness of breath.     [provider]  ezetimibe (ZETIA) 10 MG tablet Take 10 mg by mouth daily.  01/10/18   [provider]  fluticasone furoate-vilanterol (BREO ELLIPTA) 200-25 MCG/INH AEPB Inhale 1 puff into the lungs daily.    [provider]  lisinopril-hydrochlorothiazide (PRINZIDE) 10-12.5 MG per tablet Take 1 tablet by mouth daily. 06/13/14   Doug SouJacubowitz, Sam, MD  traMADol (ULTRAM) 50 MG tablet Take 1 tablet (50 mg total) by mouth every 6 (six) hours as needed for moderate pain or severe pain. 01/21/18   Barnetta Chapelsborne, Kelly, PA-C    Family History No family history on file.  Social History Social History   Tobacco Use  . Smoking status: Never Smoker  . Smokeless tobacco: Never Used  Substance Use Topics  . Alcohol use: Yes    Comment: rare  . Drug use: No     Allergies   Penicillins; Pollen extract; and Statins   Review of Systems Review of Systems  Constitutional: Negative for chills and fever.  Gastrointestinal: Negative for abdominal pain, nausea and vomiting.  Musculoskeletal: Positive for arthralgias (R hip) and back pain.  Neurological: Negative for weakness and numbness.       Negative for incontinence.      Physical Exam Updated Vital Signs BP (!) 184/96 (BP Location: Right Arm)   Pulse 84   Temp 98.2 F (36.8 C) (Oral)   Resp 18   Ht 5\' 11"  (1.803 m)   Wt 78.5 kg   SpO2 98%   BMI 24.13 kg/m   Physical Exam  Constitutional: He appears well-developed and well-nourished.  Non-toxic appearance. No distress.  HENT:  Head: Normocephalic and atraumatic.  Eyes: Conjunctivae are normal. Right eye exhibits no discharge. Left eye exhibits no discharge.  Neck: Neck supple.  Cardiovascular: Normal rate, regular rhythm and intact distal pulses.  2+ symmetric DP/PT pulses.   Pulmonary/Chest: Effort normal and breath sounds normal. No respiratory distress. He has no wheezes. He has no rhonchi. He has no rales.  Respiration even and unlabored  Abdominal: Soft.  He exhibits no distension. There is no tenderness.  Musculoskeletal:  No obvious deformity, appreciable swelling, erythema, ecchymosis, increased warmth, or open wounds Back: Patient has no point/focal midline tenderness to palpation.  No cervical, thoracic, or lumbar midline tenderness to palpation.  He has full active range of motion to bilateral hips, knees, and ankles.  He is tender to palpation along the right gluteal area as well as the right lateral hip, specifically the trochanteric bursa area.  Lower extremities are otherwise nontender.  Patient is neurovascularly intact distally.  Neurological: He is alert.  Clear speech.  Sensation grossly intact bilateral lower extremities.  5 out of 5 strength with hip flexion/extension as well as ankle plantar/dorsiflexion bilaterally.  Patellar DTRs are 2+ and symmetric.  Patient is ambulatory.  Skin: Skin is warm and dry. No rash noted.  Psychiatric: He has a normal mood and affect. His behavior is normal.  Nursing note and vitals reviewed.    ED Treatments / Results  Labs (all labs ordered are listed, but only abnormal results are displayed) Labs Reviewed - No data to display  EKG None  Radiology Dg Hip Unilat With Pelvis 2-3 Views Right  Result Date: 04/06/2018 CLINICAL DATA:  Posterior right hip pain following a strain injury yesterday. Hurts with weight-bearing. EXAM: DG HIP (WITH OR WITHOUT PELVIS) 2-3V RIGHT COMPARISON:  [facility] of January 20, 2018 FINDINGS: The bony pelvis is subjectively adequately mineralized. There is no lytic or blastic lesion. AP and lateral views of the right hip reveal mild symmetric narrowing of the joint space. There was similar findings on the left. The articular surfaces of the right femoral head and acetabulum remains smoothly rounded. The femoral neck, intertrochanteric, and subtrochanteric regions are normal. 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 No acute fracture nor  dislocation. Electronically Signed   By: David  [place] M.D.   On: 04/06/2018 13:02    Procedures Procedures (including critical care time)  Medications Ordered in ED Medications  acetaminophen (TYLENOL) tablet 650 mg (has no administration in time range)     Initial Impression / Assessment and Plan / ED Course  I have reviewed the triage vital signs and the nursing notes.  Pertinent labs & imaging results that were available during my care of the patient were reviewed by me and considered in my medical decision making (see chart for details).   Patient presents to the ED with complaints of lower back/ R hip pain s/p heavy lifting and reaching type motions over the past 2 days. Patient nontoxic appearing, in no apparent distress, vitals WNL other than elevated BP- doubt HTN emergency, patient aware of need for recheck.  Exam without obvious deformity or open wounds. No fever or overlying erythema/warmth to raise concern for infectious process. ROM intact. Tender to palpation to R gluteal area and the trochanteric bursa region.Marland Kitchen NVI distally. No midline spinal tenderness to raise concern for vertebral fracture/dislocation. R hip/pelvis Xray negative for fracture/dislocation, osteoarthritic changes present. Patient ambulatory. Will provide  robaxin and voltaren gel w/ PCP/sports med follow up, patient aware not to drive/operate heavy machinery when taking the robaxin.  I discussed results, treatment plan, need for follow-up, and return precautions with the patient and family at bedside. Provided opportunity for questions, patient and family confirmed understanding and are in agreement with plan.   Findings and plan of care discussed with supervising physician Dr. Rubin Payor- in agreement.    Final Clinical Impressions(s) / ED Diagnoses   Final diagnoses:  Right hip pain  Hypertension, unspecified type    ED Discharge Orders         Ordered    diclofenac sodium (VOLTAREN) 1 % GEL  4 times  daily     04/06/18 1344    methocarbamol (ROBAXIN) 500 MG tablet  Every 8 hours PRN     04/06/18 1344           Sylas Twombly, Gregory R, PA-C 04/06/18 1347    Benjiman Core, MD 04/06/18 1435

## 2018-04-06 NOTE — ED Triage Notes (Signed)
Pt states he injured right lower back at work yesterday then later in the day injured right hip area-NAD-to triage in w/c

## 2018-04-08 ENCOUNTER — Emergency Department (HOSPITAL_BASED_OUTPATIENT_CLINIC_OR_DEPARTMENT_OTHER)
Admission: EM | Admit: 2018-04-08 | Discharge: 2018-04-08 | Disposition: A | Discharge status: Home / Self Care | Payer: Medicare Other | Attending: Emergency Medicine | Admitting: Emergency Medicine

## 2018-04-08 ENCOUNTER — Other Ambulatory Visit: Payer: Self-pay

## 2018-04-08 ENCOUNTER — Encounter (HOSPITAL_BASED_OUTPATIENT_CLINIC_OR_DEPARTMENT_OTHER): Payer: Self-pay | Admitting: Emergency Medicine

## 2018-04-08 DIAGNOSIS — M545 Low back pain: Secondary | ICD-10-CM | POA: Diagnosis present

## 2018-04-08 DIAGNOSIS — J45909 Unspecified asthma, uncomplicated: Secondary | ICD-10-CM | POA: Diagnosis not present

## 2018-04-08 DIAGNOSIS — I10 Essential (primary) hypertension: Secondary | ICD-10-CM | POA: Insufficient documentation

## 2018-04-08 DIAGNOSIS — M5432 Sciatica, left side: Secondary | ICD-10-CM | POA: Insufficient documentation

## 2018-04-08 DIAGNOSIS — M5431 Sciatica, right side: Secondary | ICD-10-CM

## 2018-04-08 DIAGNOSIS — Z79899 Other long term (current) drug therapy: Secondary | ICD-10-CM | POA: Diagnosis not present

## 2018-04-08 MED ORDER — HYDROCODONE-ACETAMINOPHEN 5-325 MG PO TABS: 1.0000 | ORAL_TABLET | ORAL | 0 refills | Status: AC | PRN

## 2018-04-08 MED ORDER — PREDNISONE 10 MG (21) PO TBPK: ORAL_TABLET | ORAL | 0 refills | Status: AC

## 2018-04-08 MED ORDER — DEXAMETHASONE SODIUM PHOSPHATE 10 MG/ML IJ SOLN
10.0000 mg | Freq: Once | INTRAMUSCULAR | Status: AC
  Administered 2018-04-08: 10 mg via INTRAMUSCULAR
  Filled 2018-04-08: qty 1

## 2018-04-08 MED ORDER — DIAZEPAM 5 MG PO TABS
5.0000 mg | ORAL_TABLET | Freq: Once | ORAL | Status: AC
  Administered 2018-04-08: 5 mg via ORAL
  Filled 2018-04-08: qty 1

## 2018-04-08 MED ORDER — HYDROCODONE-ACETAMINOPHEN 5-325 MG PO TABS
1.0000 | ORAL_TABLET | Freq: Once | ORAL | Status: AC
  Administered 2018-04-08: 1 via ORAL
  Filled 2018-04-08: qty 1

## 2018-04-08 MED ORDER — ONDANSETRON 4 MG PO TBDP
4.0000 mg | ORAL_TABLET | Freq: Once | ORAL | Status: AC
  Administered 2018-04-08: 4 mg via ORAL
  Filled 2018-04-08: qty 1

## 2018-04-08 MED ORDER — ONDANSETRON 4 MG PO TBDP: 4.0000 mg | ORAL_TABLET | Freq: Three times a day (TID) | ORAL | 0 refills | Status: AC | PRN

## 2018-04-08 NOTE — ED Provider Notes (Signed)
[facility] EMERGENCY DEPARTMENT Provider Note   CSN: [id number] Arrival date & time: 04/08/18  2020     History   Chief Complaint Chief Complaint  Patient presents with  . Back Pain    HPI Colin Lara is a 67 y.o. male.  Pt presents to the ED today with right sided back pain and pain radiating down right leg.  The pt was seen here on 11/27 for the same.  Sx started after heavy lifting.  No trauma.  The pt was given voltaren gel and robaxin and sx are not improving.  He does have some numbness to his right lower leg, but is able to ambulate.  No trouble with bowels or bladder.  He feels nauseous when he stands up, but thinks that is due to pain.  He has an appt with his doctor next week.      Past Medical History:  Diagnosis Date  . Asthma   . Hypertension     Patient Active Problem List   Diagnosis Date Noted  . SBO (small bowel obstruction) (HCC) 01/18/2018  . Small bowel obstruction (HCC) 01/18/2018    Past Surgical History:  Procedure Laterality Date  . LAPAROSCOPY N/A 01/20/2018   Procedure: DIAGNOSTIC LAPAROSCOPY, LYSIS OF ADHESIONS;  Surgeon: Romie Levee, MD;  Location: [facility];  Service: General;  Laterality: N/A;        Home Medications    Prior to Admission medications   Medication Sig Start Date End Date Taking? Authorizing Provider  albuterol (PROVENTIL) (2.5 MG/3ML) 0.083% nebulizer solution Take 2.5 mg by nebulization every 6 (six) hours as needed for wheezing or shortness of breath.    [provider]  diclofenac sodium (VOLTAREN) 1 % GEL Apply 2 g topically 4 (four) times daily. 04/06/18   Petrucelli, Samantha R, PA-C  ezetimibe (ZETIA) 10 MG tablet Take 10 mg by mouth daily.  01/10/18   [provider]  fluticasone furoate-vilanterol (BREO ELLIPTA) 200-25 MCG/INH AEPB Inhale 1 puff into the lungs daily.    [provider]  HYDROcodone-acetaminophen (NORCO/VICODIN) 5-325 MG tablet Take 1 tablet by mouth  every 4 (four) hours as needed. 04/08/18   Jacalyn Lefevre, MD  lisinopril-hydrochlorothiazide (PRINZIDE) 10-12.5 MG per tablet Take 1 tablet by mouth daily. 06/13/14   Doug Sou, MD  methocarbamol (ROBAXIN) 500 MG tablet Take 1 tablet (500 mg total) by mouth every 8 (eight) hours as needed. 04/06/18   Petrucelli, Samantha R, PA-C  ondansetron (ZOFRAN ODT) 4 MG disintegrating tablet Take 1 tablet (4 mg total) by mouth every 8 (eight) hours as needed. 04/08/18   Jacalyn Lefevre, MD  predniSONE (STERAPRED UNI-PAK 21 TAB) 10 MG (21) TBPK tablet Take 6 tabs for 2 days, then 5 for 2 days, then 4 for 2 days, then 3 for 2 days, 2 for 2 days, then 1 for 2 days 04/08/18   Jacalyn Lefevre, MD  traMADol (ULTRAM) 50 MG tablet Take 1 tablet (50 mg total) by mouth every 6 (six) hours as needed for moderate pain or severe pain. 01/21/18   Barnetta Chapel, PA-C    Family History History reviewed. No pertinent family history.  Social History Social History   Tobacco Use  . Smoking status: Never Smoker  . Smokeless tobacco: Never Used  Substance Use Topics  . Alcohol use: Yes    Comment: rare  . Drug use: No     Allergies   Penicillins; Pollen extract; and Statins   Review of Systems Review of Systems  Musculoskeletal: Positive for back pain.  Neurological:       Right lower leg numbness  All other systems reviewed and are negative.    Physical Exam Updated Vital Signs BP (!) 184/98 (BP Location: Right Arm)   Pulse 96   Temp 98.5 F (36.9 C) (Oral)   Resp 16   Ht 5\' 11"  (1.803 m)   Wt 78.5 kg   SpO2 96%   BMI 24.13 kg/m   Physical Exam  Constitutional: He is oriented to person, place, and time. He appears well-developed and well-nourished.  HENT:  Head: Normocephalic and atraumatic.  Right Ear: External ear normal.  Left Ear: External ear normal.  Nose: Nose normal.  Mouth/Throat: Oropharynx is clear and moist.  Eyes: Pupils are equal, round, and reactive to light.  Conjunctivae and EOM are normal.  Neck: Normal range of motion. Neck supple.  Cardiovascular: Normal rate, regular rhythm, normal heart sounds and intact distal pulses.  Pulmonary/Chest: Effort normal and breath sounds normal.  Abdominal: Soft. Bowel sounds are normal.  Musculoskeletal: Normal range of motion.  Neurological: He is alert and oriented to person, place, and time.  + straight leg raise  Skin: Skin is warm. Capillary refill takes less than 2 seconds.  Psychiatric: He has a normal mood and affect. His behavior is normal. Judgment and thought content normal.  Nursing note and vitals reviewed.    ED Treatments / Results  Labs (all labs ordered are listed, but only abnormal results are displayed) Labs Reviewed - No data to display  EKG None  Radiology No results found.  Procedures Procedures (including critical care time)  Medications Ordered in ED Medications  dexamethasone (DECADRON) injection 10 mg (10 mg Intramuscular Given 04/08/18 2046)  ondansetron (ZOFRAN-ODT) disintegrating tablet 4 mg (4 mg Oral Given 04/08/18 2046)  HYDROcodone-acetaminophen (NORCO/VICODIN) 5-325 MG per tablet 1 tablet (1 tablet Oral Given 04/08/18 2046)  diazepam (VALIUM) tablet 5 mg (5 mg Oral Given 04/08/18 2046)     Initial Impression / Assessment and Plan / ED Course  I have reviewed the triage vital signs and the nursing notes.  Pertinent labs & imaging results that were available during my care of the patient were reviewed by me and considered in my medical decision making (see chart for details).    Pt treated with the above meds and is feeling better.  He will be d/c home on prednisone, lortab, and zofran.  Return if worse and f/u with pcp.  Final Clinical Impressions(s) / ED Diagnoses   Final diagnoses:  Sciatica of right side    ED Discharge Orders         Ordered    predniSONE (STERAPRED UNI-PAK 21 TAB) 10 MG (21) TBPK tablet     04/08/18 2043     HYDROcodone-acetaminophen (NORCO/VICODIN) 5-325 MG tablet  Every 4 hours PRN     04/08/18 2043    ondansetron (ZOFRAN ODT) 4 MG disintegrating tablet  Every 8 hours PRN     04/08/18 2043           Jacalyn LefevreHaviland, Keiaira Donlan, MD 04/08/18 2104

## 2018-04-08 NOTE — ED Triage Notes (Signed)
Reports right lower back and right leg pain.  Seen previously for same. States he now has numbness from the knee down.

## 2019-07-22 IMAGING — DX DG ABDOMEN 1V
1 series · 1 of 1 positions shown · non-contrast
Comparison: Radiographs and CT yesterday.

CLINICAL DATA: Small bowel obstruction.  NG tube position.

EXAM:
ABDOMEN - 1 VIEW

[abdomen kub]
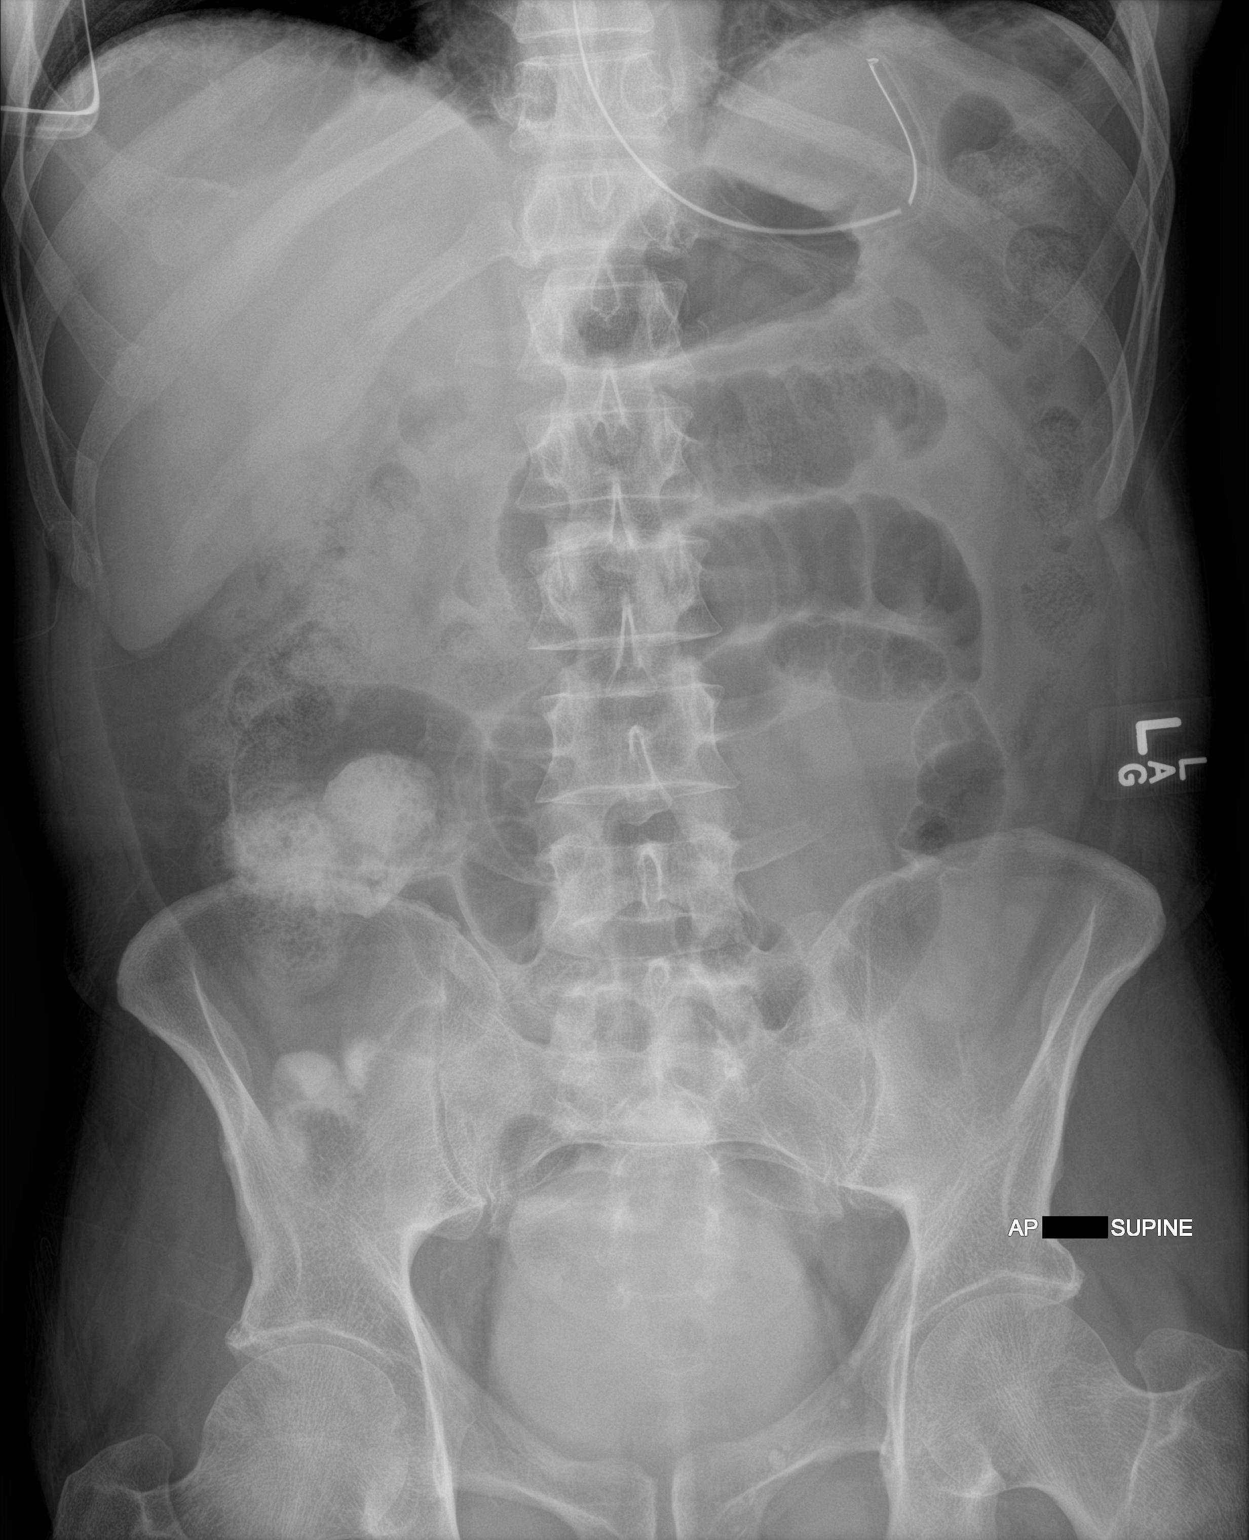

[1 of 1 positions shown; findings below may reference images not displayed]

FINDINGS: Tip and side port of the enteric tube below the diaphragm in the
stomach. Persistent gaseous distention of small bowel in the central
abdomen. Some of the administered contrast may have reached the
colon suggesting partial obstruction. Excreted IV contrast in the
bladder.
IMPRESSION: 1. Tip and side port of the enteric tube below the diaphragm in the
stomach.
2. Persistent small bowel distention in the central abdomen.
Administered enteric contrast is likely reached the ascending colon
suggesting partial obstruction.

## 2019-07-22 IMAGING — DX DG ABD PORTABLE 1V
1 series · 1 of 1 positions shown · non-contrast
Comparison: Earlier same day film at 2775 hours.

CLINICAL DATA: Small bowel obstruction.

EXAM:
PORTABLE ABDOMEN - 1 VIEW

[abdomen kub]
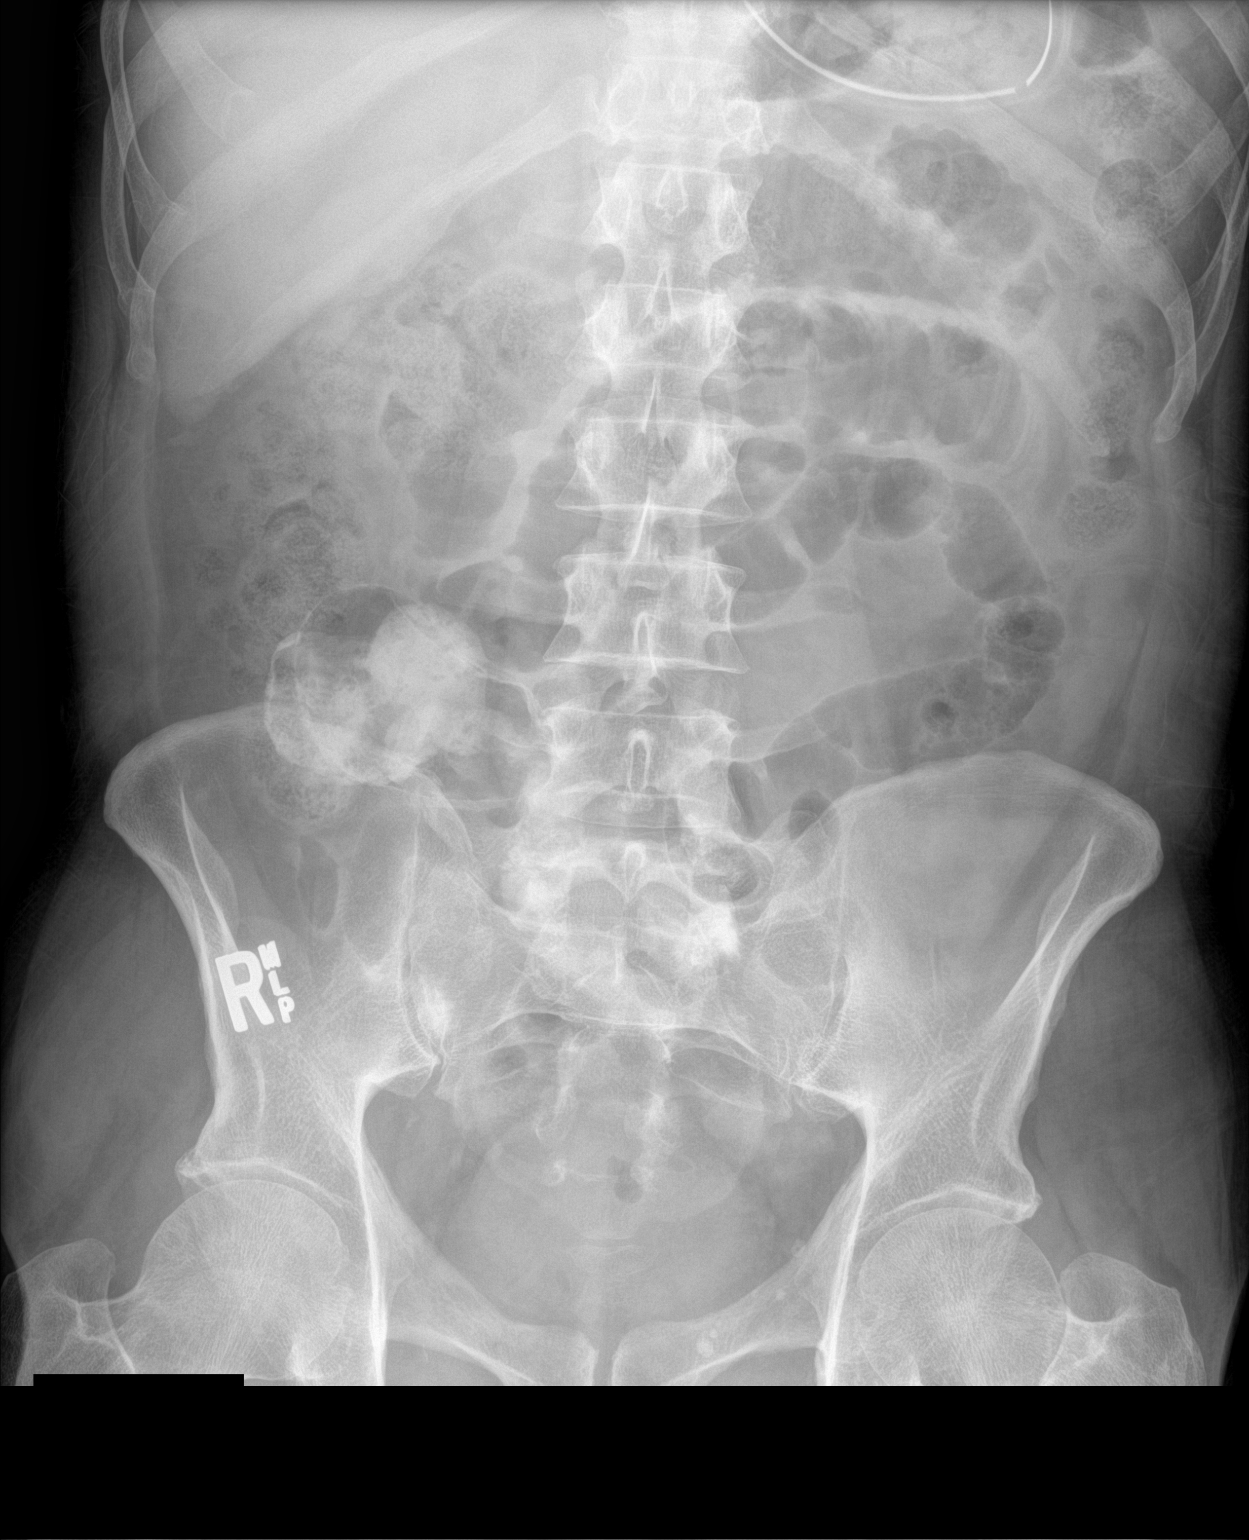

[1 of 1 positions shown; findings below may reference images not displayed]

FINDINGS: Film at 7096 hours. Overall similar appearance of gaseous bowel
distention. Small bowel loops in the central abdomen are similar to
prior. There is contrast material overlying the right abdomen
without substantial change in position, but likely in the right
colon.
IMPRESSION: No substantial interval change in the 6 hours since the prior study.

## 2019-07-23 IMAGING — DX DG ABD PORTABLE 1V
1 series · 1 of 1 positions shown · non-contrast
Comparison: Portable exam 4729 hours compared to prior exams from
01/19/2018

CLINICAL DATA: Small-bowel obstruction, follow-up

EXAM:
PORTABLE ABDOMEN - 1 VIEW

[abdomen kub]
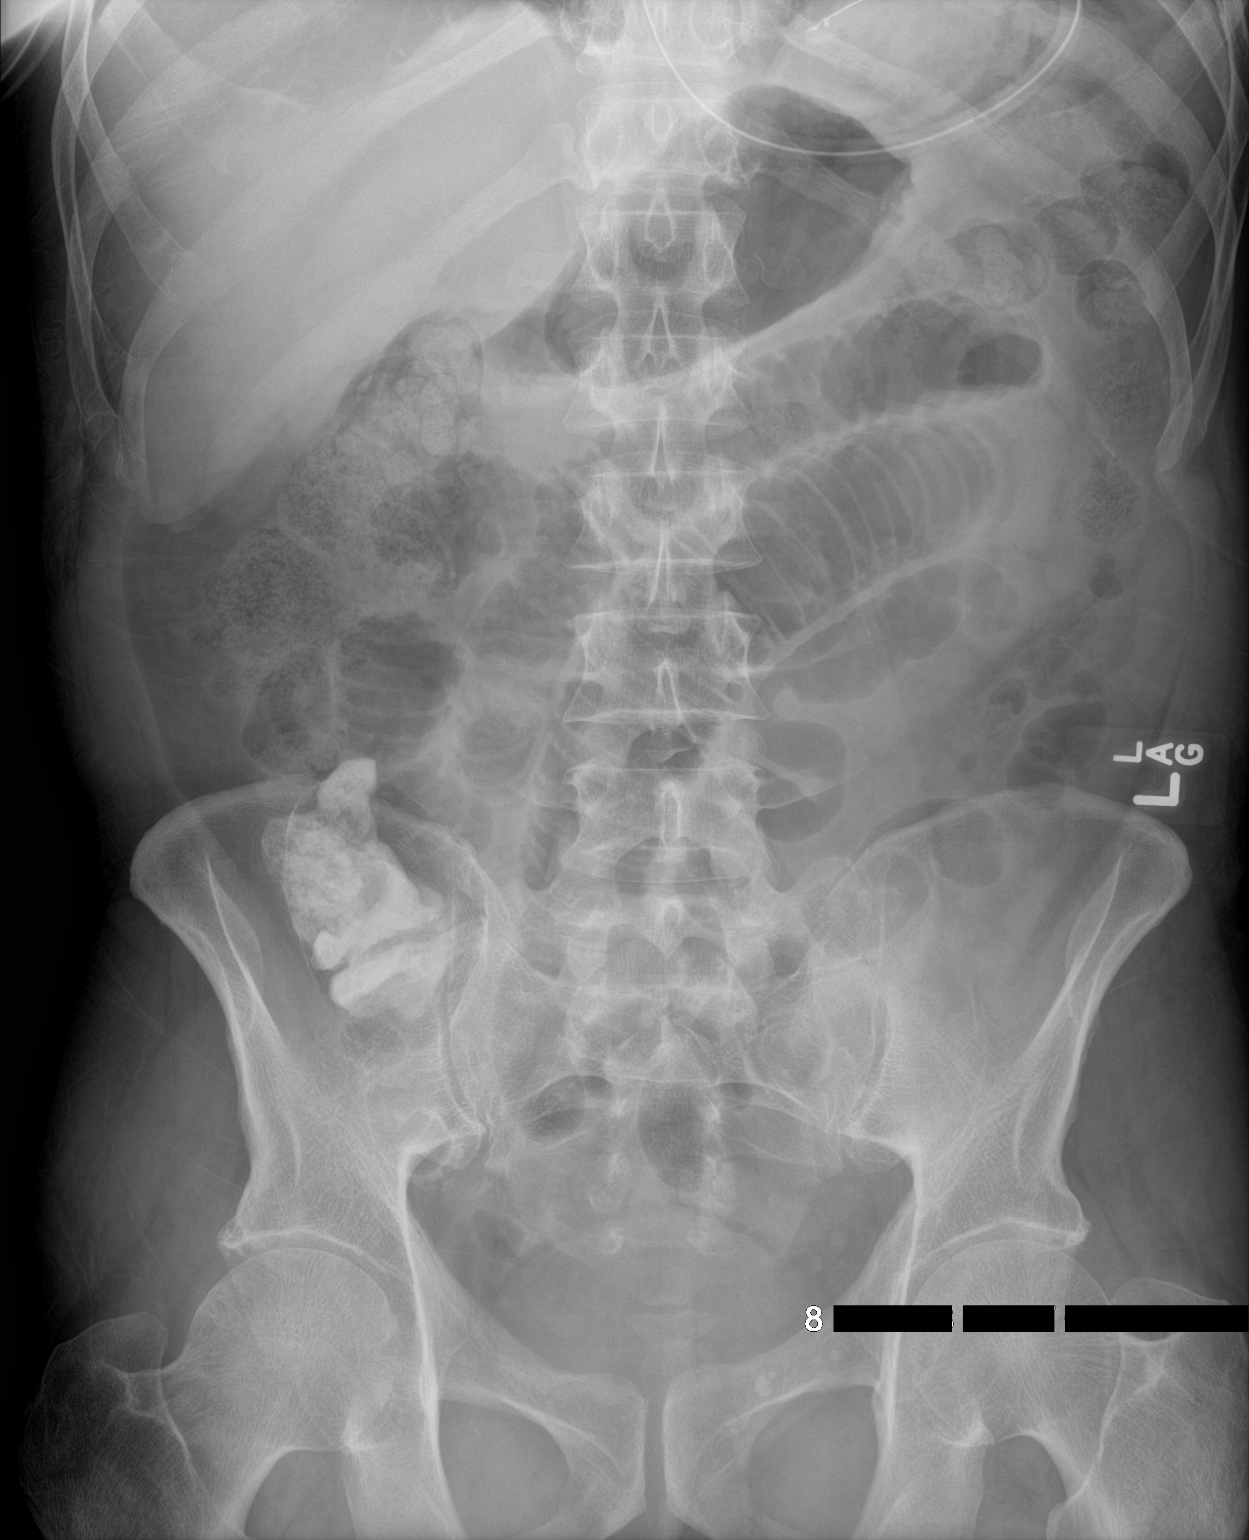

[1 of 1 positions shown; findings below may reference images not displayed]

FINDINGS: Small amount of residual contrast material is identified in the
RIGHT lower quadrant, likely within cecum.

Persistent significant distention of small bowel loops in the mid
abdomen up to 4.4 cm diameter.

Stool noted at RIGHT colon and pack flexure, minimally more distally
in colon.

No definite bowel wall thickening.

Osseous structures unremarkable.

Nasogastric tube coiled in proximal stomach.
IMPRESSION: Persistent small bowel distention.

## 2021-09-20 ENCOUNTER — Emergency Department (HOSPITAL_BASED_OUTPATIENT_CLINIC_OR_DEPARTMENT_OTHER)
Admission: EM | Admit: 2021-09-20 | Discharge: 2021-09-21 | Disposition: A | Discharge status: Home / Self Care | Payer: Medicare HMO | Attending: Emergency Medicine | Admitting: Emergency Medicine

## 2021-09-20 ENCOUNTER — Other Ambulatory Visit: Payer: Self-pay

## 2021-09-20 ENCOUNTER — Emergency Department (HOSPITAL_BASED_OUTPATIENT_CLINIC_OR_DEPARTMENT_OTHER): Payer: Medicare HMO

## 2021-09-20 ENCOUNTER — Encounter (HOSPITAL_BASED_OUTPATIENT_CLINIC_OR_DEPARTMENT_OTHER): Payer: Self-pay | Admitting: Emergency Medicine

## 2021-09-20 DIAGNOSIS — I10 Essential (primary) hypertension: Secondary | ICD-10-CM | POA: Insufficient documentation

## 2021-09-20 DIAGNOSIS — Z7951 Long term (current) use of inhaled steroids: Secondary | ICD-10-CM | POA: Diagnosis not present

## 2021-09-20 DIAGNOSIS — M25511 Pain in right shoulder: Secondary | ICD-10-CM | POA: Diagnosis not present

## 2021-09-20 DIAGNOSIS — R519 Headache, unspecified: Secondary | ICD-10-CM | POA: Diagnosis not present

## 2021-09-20 DIAGNOSIS — R109 Unspecified abdominal pain: Secondary | ICD-10-CM

## 2021-09-20 DIAGNOSIS — N400 Enlarged prostate without lower urinary tract symptoms: Secondary | ICD-10-CM | POA: Insufficient documentation

## 2021-09-20 DIAGNOSIS — Z79899 Other long term (current) drug therapy: Secondary | ICD-10-CM | POA: Diagnosis not present

## 2021-09-20 DIAGNOSIS — J45909 Unspecified asthma, uncomplicated: Secondary | ICD-10-CM | POA: Insufficient documentation

## 2021-09-20 DIAGNOSIS — N3001 Acute cystitis with hematuria: Secondary | ICD-10-CM

## 2021-09-20 DIAGNOSIS — D72829 Elevated white blood cell count, unspecified: Secondary | ICD-10-CM | POA: Diagnosis not present

## 2021-09-20 LAB — COMPREHENSIVE METABOLIC PANEL
ALT: 21 U/L (ref 0–44)
AST: 22 U/L (ref 15–41)
Albumin: 3.7 g/dL (ref 3.5–5.0)
Alkaline Phosphatase: 63 U/L (ref 38–126)
Anion gap: 8 (ref 5–15)
BUN: 18 mg/dL (ref 8–23)
CO2: 23 mmol/L (ref 22–32)
Calcium: 8.6 mg/dL — ABNORMAL LOW (ref 8.9–10.3)
Chloride: 107 mmol/L (ref 98–111)
Creatinine, Ser: 1.19 mg/dL (ref 0.61–1.24)
GFR, Estimated: >60 mL/min (ref >60)
Glucose, Bld: 93 mg/dL (ref 70–99)
Potassium: 3.8 mmol/L (ref 3.5–5.1)
Sodium: 138 mmol/L (ref 135–145)
Total Bilirubin: 0.6 mg/dL (ref 0.3–1.2)
Total Protein: 7.9 g/dL (ref 6.5–8.1)

## 2021-09-20 LAB — URINALYSIS, MICROSCOPIC (REFLEX): WBC, UA: >50 WBC/hpf (ref 0–5)

## 2021-09-20 LAB — URINALYSIS, ROUTINE W REFLEX MICROSCOPIC
Bilirubin Urine: NEGATIVE
Glucose, UA: NEGATIVE mg/dL
Ketones, ur: NEGATIVE mg/dL
Nitrite: POSITIVE — AB
Protein, ur: 100 mg/dL — AB
Specific Gravity, Urine: 1.02 (ref 1.005–1.030)
pH: 5.5 (ref 5.0–8.0)

## 2021-09-20 LAB — CBC
HCT: 39.3 % (ref 39.0–52.0)
Hemoglobin: 12.8 g/dL — ABNORMAL LOW (ref 13.0–17.0)
MCH: 29.3 pg (ref 26.0–34.0)
MCHC: 32.6 g/dL (ref 30.0–36.0)
MCV: 89.9 fL (ref 80.0–100.0)
Platelets: 335 10*3/uL (ref 150–400)
RBC: 4.37 MIL/uL (ref 4.22–5.81)
RDW: 13.5 % (ref 11.5–15.5)
WBC: 14.9 10*3/uL — ABNORMAL HIGH (ref 4.0–10.5)
nRBC: 0 % (ref 0.0–0.2)

## 2021-09-20 LAB — LIPASE, BLOOD: Lipase: 25 U/L (ref 11–51)

## 2021-09-20 MED ORDER — IOHEXOL 300 MG/ML  SOLN
100.0000 mL | Freq: Once | INTRAMUSCULAR | Status: AC | PRN
  Administered 2021-09-20: 100 mL via INTRAVENOUS

## 2021-09-20 MED ORDER — CIPROFLOXACIN HCL 500 MG PO TABS: 500.0000 mg | ORAL_TABLET | Freq: Two times a day (BID) | ORAL | 0 refills | Status: AC
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
## 2021-09-20 NOTE — Discharge Instructions (Addendum)
You were seen in the emergency department for flank pain and urinary symptoms. ? ?As we discussed your CT was normal.  Your urine showed that you have a urinary tract infection.  I have placed you on antibiotics. It is important you finish the entire course. ? ?I recommend you follow up with your primary doctor.  Return to the ER for any new or worsening symptoms. ?

## 2023-11-23 ENCOUNTER — Emergency Department (HOSPITAL_BASED_OUTPATIENT_CLINIC_OR_DEPARTMENT_OTHER)
Admission: EM | Admit: 2023-11-23 | Discharge: 2023-11-23 | Disposition: A | Discharge status: Home / Self Care | Attending: Emergency Medicine | Admitting: Emergency Medicine

## 2023-11-23 ENCOUNTER — Encounter (HOSPITAL_BASED_OUTPATIENT_CLINIC_OR_DEPARTMENT_OTHER): Payer: Self-pay

## 2023-11-23 ENCOUNTER — Other Ambulatory Visit: Payer: Self-pay

## 2023-11-23 DIAGNOSIS — K649 Unspecified hemorrhoids: Secondary | ICD-10-CM | POA: Diagnosis not present

## 2023-11-23 DIAGNOSIS — K610 Anal abscess: Secondary | ICD-10-CM | POA: Diagnosis present

## 2023-11-23 DIAGNOSIS — J45909 Unspecified asthma, uncomplicated: Secondary | ICD-10-CM | POA: Diagnosis not present

## 2023-11-23 DIAGNOSIS — Z79899 Other long term (current) drug therapy: Secondary | ICD-10-CM | POA: Diagnosis not present

## 2023-11-23 DIAGNOSIS — I10 Essential (primary) hypertension: Secondary | ICD-10-CM | POA: Insufficient documentation

## 2023-11-23 DIAGNOSIS — Z7951 Long term (current) use of inhaled steroids: Secondary | ICD-10-CM | POA: Insufficient documentation

## 2023-11-23 MED ORDER — HYDROCORTISONE (PERIANAL) 2.5 % EX CREA: 1.0000 | TOPICAL_CREAM | Freq: Two times a day (BID) | CUTANEOUS | 0 refills | Status: AC

## 2023-11-23 NOTE — ED Provider Notes (Signed)
 [place] EMERGENCY DEPARTMENT AT [facility] Provider Note   CSN: [id number] Arrival date & time: 11/23/23  0001     Patient presents with: Abscess   Colin Lara is a 73 y.o. male.   The history is provided by the patient.  Abscess He has history of hypertension, asthma and comes in because of a perianal abscess which she noted yesterday.  Today's mildly painful.  He denies any fever or chills.  He denies any bleeding.  He states that he had shaved himself and wonders if that may have led to his problem.   Prior to Admission medications   Medication Sig Start Date End Date Taking? Authorizing Provider  albuterol  (PROVENTIL ) (2.5 MG/3ML) 0.083% nebulizer solution Take 2.5 mg by nebulization every 6 (six) hours as needed for wheezing or shortness of breath.    [provider]  diclofenac  sodium (VOLTAREN ) 1 % GEL Apply 2 g topically 4 (four) times daily. 04/06/18   Petrucelli, Samantha R, PA-C  ezetimibe (ZETIA) 10 MG tablet Take 10 mg by mouth daily.  01/10/18   [provider]  fluticasone  furoate-vilanterol (BREO ELLIPTA ) 200-25 MCG/INH AEPB Inhale 1 puff into the lungs daily.    [provider]  HYDROcodone -acetaminophen  (NORCO/VICODIN) 5-325 MG tablet Take 1 tablet by mouth every 4 (four) hours as needed. 04/08/18   Dean Clarity, MD  lisinopril -hydrochlorothiazide  (PRINZIDE ) 10-12.5 MG per tablet Take 1 tablet by mouth daily. 06/13/14   Josem Bring, MD  methocarbamol  (ROBAXIN ) 500 MG tablet Take 1 tablet (500 mg total) by mouth every 8 (eight) hours as needed. 04/06/18   Petrucelli, Samantha R, PA-C  ondansetron  (ZOFRAN  ODT) 4 MG disintegrating tablet Take 1 tablet (4 mg total) by mouth every 8 (eight) hours as needed. 04/08/18   Dean Clarity, MD  predniSONE  (STERAPRED UNI-PAK 21 TAB) 10 MG (21) TBPK tablet Take 6 tabs for 2 days, then 5 for 2 days, then 4 for 2 days, then 3 for 2 days, 2 for 2 days, then 1 for 2 days 04/08/18    Dean Clarity, MD  traMADol  (ULTRAM ) 50 MG tablet Take 1 tablet (50 mg total) by mouth every 6 (six) hours as needed for moderate pain or severe pain. 01/21/18   Tammy Sor, PA-C    Allergies: Penicillins, Pollen extract, and Statins    Review of Systems  All other systems reviewed and are negative.   Updated Vital Signs BP (!) 155/86   Pulse 92   Temp 97.7 F (36.5 C) (Oral)   Resp 18   SpO2 97%   Physical Exam Vitals and nursing note reviewed.   73 year old male, resting comfortably and in no acute distress. Vital signs are significant for elevated blood pressure. Oxygen saturation is 97%, which is normal. Head is normocephalic and atraumatic. PERRLA, EOMI. . Lungs are clear without rales, wheezes, or rhonchi. Chest is nontender. Heart has regular rate and rhythm without murmur. Abdomen is soft, flat, nontender. Rectal: Hemorrhoid present on the left side which is mildly tender.  There is no bleeding. Neurologic: Awake and alert, moves all extremities equally.    Procedures   Medications Ordered in the ED - No data to display                                  Medical Decision Making  Hemorrhoid.  This does not appear to be serious enough to warrant incision and drainage.  I am discharging him with a prescription for Anusol  cream, referring him back to primary care provider as needed.     Final diagnoses:  Hemorrhoids, unspecified hemorrhoid type    ED Discharge Orders          Ordered    hydrocortisone  (ANUSOL -HC) 2.5 % rectal cream  2 times daily        11/23/23 0207               Raford Lenis, MD 11/23/23 0210

## 2023-11-23 NOTE — ED Triage Notes (Signed)
 Pt reports abscess around anus that he first noticed a day ago.
